# Patient Record
Sex: Female | Born: 1975 | Race: White | Hispanic: No | Marital: Married | Smoking: Never smoker
Health system: Southern US, Community
[De-identification: ages and names within clinical notes are randomized; demographics above are authoritative.]

## PROBLEM LIST (undated history)

## (undated) ENCOUNTER — Inpatient Hospital Stay (HOSPITAL_COMMUNITY): Payer: Self-pay

## (undated) DIAGNOSIS — N3010 Interstitial cystitis (chronic) without hematuria: Secondary | ICD-10-CM

## (undated) DIAGNOSIS — K602 Anal fissure, unspecified: Secondary | ICD-10-CM

## (undated) DIAGNOSIS — K219 Gastro-esophageal reflux disease without esophagitis: Secondary | ICD-10-CM

## (undated) HISTORY — PX: CHOLECYSTECTOMY: SHX55

## (undated) HISTORY — DX: Anal fissure, unspecified: K60.2

## (undated) HISTORY — DX: Gastro-esophageal reflux disease without esophagitis: K21.9

## (undated) HISTORY — DX: Interstitial cystitis (chronic) without hematuria: N30.10

## (undated) HISTORY — PX: APPENDECTOMY: SHX54

## (undated) HISTORY — PX: ENDOMETRIAL ABLATION: SHX621

---

## 2006-08-19 DIAGNOSIS — K602 Anal fissure, unspecified: Secondary | ICD-10-CM

## 2006-08-19 HISTORY — DX: Anal fissure, unspecified: K60.2

## 2014-09-06 ENCOUNTER — Emergency Department (HOSPITAL_COMMUNITY): Payer: Federal, State, Local not specified - PPO

## 2014-09-06 ENCOUNTER — Encounter (HOSPITAL_COMMUNITY): Payer: Self-pay | Admitting: *Deleted

## 2014-09-06 ENCOUNTER — Emergency Department (HOSPITAL_COMMUNITY)
Admission: EM | Admit: 2014-09-06 | Discharge: 2014-09-06 | Disposition: A | Discharge status: Home / Self Care | Payer: Federal, State, Local not specified - PPO | Attending: Emergency Medicine | Admitting: Emergency Medicine

## 2014-09-06 DIAGNOSIS — Y9289 Other specified places as the place of occurrence of the external cause: Secondary | ICD-10-CM | POA: Insufficient documentation

## 2014-09-06 DIAGNOSIS — Y9351 Activity, roller skating (inline) and skateboarding: Secondary | ICD-10-CM | POA: Diagnosis not present

## 2014-09-06 DIAGNOSIS — Y998 Other external cause status: Secondary | ICD-10-CM | POA: Insufficient documentation

## 2014-09-06 DIAGNOSIS — S62141A Displaced fracture of body of hamate [unciform] bone, right wrist, initial encounter for closed fracture: Secondary | ICD-10-CM

## 2014-09-06 DIAGNOSIS — Z7982 Long term (current) use of aspirin: Secondary | ICD-10-CM | POA: Insufficient documentation

## 2014-09-06 DIAGNOSIS — S6991XA Unspecified injury of right wrist, hand and finger(s), initial encounter: Secondary | ICD-10-CM | POA: Diagnosis present

## 2014-09-06 DIAGNOSIS — W19XXXA Unspecified fall, initial encounter: Secondary | ICD-10-CM

## 2014-09-06 DIAGNOSIS — S6990XA Unspecified injury of unspecified wrist, hand and finger(s), initial encounter: Secondary | ICD-10-CM

## 2014-09-06 DIAGNOSIS — S62144A Nondisplaced fracture of body of hamate [unciform] bone, right wrist, initial encounter for closed fracture: Secondary | ICD-10-CM | POA: Insufficient documentation

## 2014-09-06 MED ORDER — IBUPROFEN 800 MG PO TABS
800.0000 mg | ORAL_TABLET | Freq: Once | ORAL | Status: AC
  Administered 2014-09-06: 800 mg via ORAL
  Filled 2014-09-06: qty 1

## 2014-09-06 NOTE — ED Notes (Signed)
Pt escorted to discharge window. Pt verbalized understanding discharge instructions. In no acute distress.  

## 2014-09-06 NOTE — ED Provider Notes (Signed)
CSN: [id number]     Arrival date & time 09/06/14  0750 History   First MD Initiated Contact with Patient 09/06/14 0754     Chief Complaint  Patient presents with  . Wrist Injury  . Hand Injury     (Consider location/radiation/quality/duration/timing/severity/associated sxs/prior Treatment) HPI  Casey Oneill is a 39 y.o. female is otherwise healthy presenting with fall last night while rollerskating. She noted bilateral wrist pain that improves some with Bayer Aspirin but has been persistent. It is worse with movement. Patient states when she fell she fell on her back and held her arms up but she did notice some abrasions on her forearm and cannot recall hitting her wrist on anything. She denies any back pain, loss of control of bladder or bowel, numbness tingling, weakness or saddle ansesthesia. Patient notes increased pain on the right hand in the distribution of fourth and fifth fingers. She denies weakness but reports increased pain with movement.   History reviewed. No pertinent past medical history. Past Surgical History  Procedure Laterality Date  . Cesarean section    . Endometrial ablation      removal of scar tissue   History reviewed. No pertinent family history. History  Substance Use Topics  . Smoking status: Never Smoker   . Smokeless tobacco: Not on file  . Alcohol Use: Yes     Comment: rarely   OB History    No data available     Review of Systems  Constitutional: Negative for fever and chills.  Skin: Positive for wound. Negative for color change.  Neurological: Negative for weakness and numbness.      Allergies  Review of patient's allergies indicates no known allergies.  Home Medications   Prior to Admission medications   Medication Sig Start Date End Date Taking? Authorizing Provider  aspirin 500 MG tablet Take 1,500 mg by mouth 2 (two) times daily as needed for pain.   Yes Historical Provider, MD   BP 122/75 mmHg  Pulse 67  Temp(Src) 97.4 F  (36.3 C) (Oral)  Resp 16  SpO2 99%  LMP 08/23/2014 (Exact Date) Physical Exam  Constitutional: She appears well-developed and well-nourished. No distress.  HENT:  Head: Normocephalic and atraumatic.  Eyes: Conjunctivae are normal. Right eye exhibits no discharge. Left eye exhibits no discharge.  Cardiovascular:  2+ radial pulses equal bilaterally. Less than 3 seconds cap refill  Pulmonary/Chest: Effort normal. No respiratory distress.  Musculoskeletal:  No erythema, edema, warmth to bilateral fingers wrist and forearm. Full range of motion. Tenderness to right fourth and fifth fingers as well as dorsal hand. Tenderness with range of motion of left wrist.  Neurological: She is alert. Coordination normal.  5/5 strength of bilateral upper extremities. Sensation intact.  Skin: She is not diaphoretic.  Psychiatric: She has a normal mood and affect. Her behavior is normal.  Nursing note and vitals reviewed.   ED Course  Procedures (including critical care time) Labs Review Labs Reviewed - No data to display  Imaging Review Dg Wrist Complete Left  09/06/2014   CLINICAL DATA:  Fall roller-skating. Wrist pain. Initial encounter.  EXAM: LEFT WRIST - COMPLETE 3+ VIEW  COMPARISON:  None.  FINDINGS: There is no evidence of fracture or dislocation. There is no evidence of arthropathy or other focal bone abnormality. Soft tissues are unremarkable.  IMPRESSION: Negative.   Electronically Signed   By: Marlan Palauharles  Clark M.D.   On: 09/06/2014 09:19   Dg Wrist Complete Right  09/06/2014  CLINICAL DATA:  Fall roller-skating last night. Wrist pain. Initial encounter.  EXAM: RIGHT WRIST - COMPLETE 3+ VIEW  COMPARISON:  None.  FINDINGS: Nondisplaced fracture of the medial hamate. No other fracture or arthropathy.  IMPRESSION: Nondisplaced fracture hamate   Electronically Signed   By: Marlan Palau M.D.   On: 09/06/2014 08:59     EKG Interpretation None      MDM   Final diagnoses:  Wrist injury   Closed hamate fracture, right, initial encounter  Fall, initial encounter   Patient X-Ray positive for right nondisplaced fracture of hamate. No obvious fracture or dislocation in left wrist. Bilaterally neuro vascularly intact. Right splint placed and patient to follow-up with hand orthopedic surgeon. Pain managed in ED. Pt without erythema, edema or warmth to joint. I doubt septic arthritis. Pt advised to follow up with PCP/orthopedics if symptoms persist. Disscussed RICE, ibuprofen and conservative therapy recommended and discussed. Answered all questions about splint care.  Discussed return precautions with patient. Discussed all results and patient verbalizes understanding and agrees with plan.    Louann Sjogren, PA-C 09/06/14 1106  Enid Skeens, MD 09/06/14 [phone]

## 2014-09-06 NOTE — ED Notes (Signed)
Pt alert and oriented x4. Respirations even and unlabored, bilateral symmetrical rise and fall of chest. Skin warm and dry. In no acute distress. Denies needs.   

## 2014-09-06 NOTE — ED Notes (Signed)
Ortho called 

## 2014-09-06 NOTE — ED Notes (Signed)
Pt reports she went roller skating last night, was standing on rink and legs slipped out from under herself, pt fell backwards landing on lower back, hit head, denies LOC. Pt does not think she used hands to brace impact, because she was holding a cell phone. Pt did land on upper arms, possibly elbows. As night progressed pt started having right wrist pain with rotation and stiffness, with slight numbness in right ring finger and pinkie finger. Decreased strength in fingers. This morning now slight numbness in left ring finger and left pinkie finger as well as right hand fingers.

## 2014-09-06 NOTE — Discharge Instructions (Signed)
Return to the emergency room with worsening of symptoms, new symptoms or with symptoms that are concerning, especially fevers, redness, swelling, unable to move fingers under cast, blue tinge to skin, numbness, tingling, weakness. Call to make follow-up appointment with hand orthopedist. RICE: Rest, Ice (three cycles of 20 mins on, 20mins off at least twice a day), compression/brace, elevation. Heating pad works well for back pain. Ibuprofen 400mg  (2 tablets 200mg ) every 5-6 hours for 3-5 days  Follow up with orthopedist. Follow-up with recommendations for splint care.   Cast or Splint Care Casts and splints support injured limbs and keep bones from moving while they heal. It is important to care for your cast or splint at home.  HOME CARE INSTRUCTIONS  Keep the cast or splint uncovered during the drying period. It can take 24 to 48 hours to dry if it is made of plaster. A fiberglass cast will dry in less than 1 hour.  Do not rest the cast on anything harder than a pillow for the first 24 hours.  Do not put weight on your injured limb or apply pressure to the cast until your health care provider gives you permission.  Keep the cast or splint dry. Wet casts or splints can lose their shape and may not support the limb as well. A wet cast that has lost its shape can also create harmful pressure on your skin when it dries. Also, wet skin can become infected.  Cover the cast or splint with a plastic bag when bathing or when out in the rain or snow. If the cast is on the trunk of the body, take sponge baths until the cast is removed.  If your cast does become wet, dry it with a towel or a blow dryer on the cool setting only.  Keep your cast or splint clean. Soiled casts may be wiped with a moistened cloth.  Do not place any hard or soft foreign objects under your cast or splint, such as cotton, toilet paper, lotion, or powder.  Do not try to scratch the skin under the cast with any object. The  object could get stuck inside the cast. Also, scratching could lead to an infection. If itching is a problem, use a blow dryer on a cool setting to relieve discomfort.  Do not trim or cut your cast or remove padding from inside of it.  Exercise all joints next to the injury that are not immobilized by the cast or splint. For example, if you have a long leg cast, exercise the hip joint and toes. If you have an arm cast or splint, exercise the shoulder, elbow, thumb, and fingers.  Elevate your injured arm or leg on 1 or 2 pillows for the first 1 to 3 days to decrease swelling and pain.It is best if you can comfortably elevate your cast so it is higher than your heart. SEEK MEDICAL CARE IF:   Your cast or splint cracks.  Your cast or splint is too tight or too loose.  You have unbearable itching inside the cast.  Your cast becomes wet or develops a soft spot or area.  You have a bad smell coming from inside your cast.  You get an object stuck under your cast.  Your skin around the cast becomes red or raw.  You have new pain or worsening pain after the cast has been applied. SEEK IMMEDIATE MEDICAL CARE IF:   You have fluid leaking through the cast.  You are unable to  move your fingers or toes.  You have discolored (blue or white), cool, painful, or very swollen fingers or toes beyond the cast.  You have tingling or numbness around the injured area.  You have severe pain or pressure under the cast.  You have any difficulty with your breathing or have shortness of breath.  You have chest pain. Document Released: 08/02/2000 Document Revised: 05/26/2013 Document Reviewed: 02/11/2013 [facility] Patient Information 2015 [place], [place]. This information is not intended to replace advice given to you by your health care provider. Make sure you discuss any questions you have with your health care provider.    Emergency Department Resource Guide 1) Find a Doctor and Pay Out of  Pocket Although you won't have to find out who is covered by your insurance plan, it is a good idea to ask around and get recommendations. You will then need to call the office and see if the doctor you have chosen will accept you as a new patient and what types of options they offer for patients who are self-pay. Some doctors offer discounts or will set up payment plans for their patients who do not have insurance, but you will need to ask so you aren't surprised when you get to your appointment.  2) Contact Your Local Health Department Not all health departments have doctors that can see patients for sick visits, but many do, so it is worth a call to see if yours does. If you don't know where your local health department is, you can check in your phone book. The CDC also has a tool to help you locate your state's health department, and many state websites also have listings of all of their local health departments.  3) Find a Walk-in Clinic If your illness is not likely to be very severe or complicated, you may want to try a walk in clinic. These are popping up all over the country in pharmacies, drugstores, and shopping centers. They're usually staffed by nurse practitioners or physician assistants that have been trained to treat common illnesses and complaints. They're usually fairly quick and inexpensive. However, if you have serious medical issues or chronic medical problems, these are probably not your best option.  No Primary Care Doctor: - Call Health Connect at  [phone] - they can help you locate a primary care doctor that  accepts your insurance, provides certain services, etc. - Physician Referral Service- [phone]  Chronic Pain Problems: Organization         Address  Phone   Notes  [facility]  [phone] Patients need to be referred by their primary care doctor.   Medication Assistance: Organization         Address  Phone   Notes  [facility]  Medication [facility] [address] [place], [place] [address] [phone] --Must be a resident of [facility] -- Must have NO insurance coverage whatsoever (no Medicaid/ Medicare, etc.) -- The pt. MUST have a primary care doctor that directs their care regularly and follows them in the community   [facility]ist  [phone]   [employer]  [phone]    Agencies that provide inexpensive medical care: Organization         Address  Phone   Notes  [facility]  [phone]   [facility]    [phone]   [facility] [address] [place], [place] [address] [phone]  [facility] [address] [place] [place] [phone]   Planned Parenthood    [phone]   [facility]    [phone]   Community [facility] and [facility]  [address], [place] Phone:  [phone], Fax:  [phone] Hours of Operation:  9 am - 6 pm, M-F.  Also accepts Medicaid/Medicare and self-pay.  [facility]  [address], [place] Phone: [phone], Fax: [phone]. Hours of Operation:  8:30 am - 5:30 pm, M-F.  Also accepts Medicaid and self-pay.  [facility] [place] [address] [place] Phone: [phone]   [facility] [address] Natasha Bence [place], [place] [phone], Ext. [phone] Mondays & Thursdays: 7-9 AM.  First 15 patients are seen on a first come, first serve basis.    Medicaid-accepting [facility] Providers:  Organization         Address  Phone   Notes  [facility] [address],  [phone] Also accepts self-pay patients.  [facility] [address] Laurell Josephs [place], [place]  [phone]   [facility] [address], [place] [phone]   [facility] [address], [place] [phone]   Renaye Rakers [address], [place]   [phone] Only accepts [place] Access [place] patients after they have their name applied to their card.   Self-Pay (no insurance) in [facility]:  Organization         Address  Phone   Notes  Sickle Cell Patients, [facility] [address] [place], [place] [phone]   [facility] [address] [place], [place] [phone]   [facility] [place]  [address], [place] [phone]   [facility]/Dr. Osei-Bonsu  [address], [place] or [address], [place] [phone] Phone number for both [place] and [place] locations is the same.  [facility]gent [facility] and [facility] [address] [place] [phone]   [facility] [address], [place] or [address] [phone]   [facility] [address] [place] [phone], phone; [phone], fax Sees patients 1st and 3rd Saturday of every month.  Must not qualify for public or private insurance (i.e. Medicaid, Medicare, [place] Health Choice, Veterans' Benefits)  Household income should be no more than 200% of the poverty level The clinic cannot treat you if you are pregnant or think you are pregnant  Sexually transmitted diseases are not treated at the clinic.    Dental Care: Organization         Address  Phone  Notes  [facility] Department of [facility] [address] [place], [place] [phone] Accepts children up to age 57 who are enrolled in [place] or [place] Health Choice; pregnant women with a Medicaid card; and children who have applied for Medicaid or [place] Health Choice, but were declined, whose parents can pay a reduced fee at time of service.  [facility] Department of [facility]  [address], [place]  [phone] Accepts children up to age 51 who are enrolled in [place] or [place] Health Choice; pregnant women with a Medicaid card; and  children who have applied for Medicaid or [place] Health Choice, but were declined, whose parents can pay a reduced fee at time of service.  [facility]  [address] [place], [place] [phone] Patients are seen by appointment only. Walk-ins are not accepted. [facility] will see patients 93 years of age and older. Monday - Tuesday (8am-5pm) Most Wednesdays (8:30-5pm) $30 per visit, cash only  [facility]  [address], [facility] [phone] Patients are seen by appointment only. Walk-ins are not accepted. [facility] will see patients 34 years of age and older. One Wednesday Evening (Monthly: Volunteer Based).  $30 per visit, cash only  [employer]  [phone] for adults; Children under age 55, call Graduate Pediatric Dentistry at [phone]. Children aged 20-14, please call [phone] to request a pediatric application.  Dental services are provided in all areas of dental care including fillings, crowns and bridges, complete and partial dentures, implants, gum treatment, root canals, and extractions. Preventive care is also provided. Treatment is provided to both adults and children. Patients are selected via a lottery and there is often a waiting list.   [facility] [address] [place]  [phone] [email or web address]   [facility] [address] [place], [place] [phone], Ext. [phone] Second and Fourth Thursday of each month, opens at 6:30 AM; Clinic ends at 9 AM.  Patients are seen on a first-come first-served basis, and a limited number are seen during each clinic.   [facility]  [address] Ether Griffins [place], [place] [phone]   Eligibility Requirements You must have lived in [place], [place], or [place]  counties for at least the last three months.   You cannot be eligible for state or federal sponsored [place], including [employer], [place], or [employer].   You generally cannot be eligible for healthcare insurance through your employer.    How to apply: Eligibility screenings are held every Tuesday and Wednesday afternoon from 1:00 pm until 4:00 pm. You do not need an appointment for the interview!  [facility] [address] [place], [place] [phone]   [facility] Health Department  [phone]   [facility] Health Department  [phone]   [facility] Health Department  [phone]    Behavioral Health Resources in the Community: Intensive Outpatient Programs Organization         Address  Phone  Notes  [facility] Services [address] [place], [place] [phone]   [facility] [address] [place], [place] [phone]   [facility] [address] [place], [place]  [phone]   [facility] Mental Health [address],  [place], [place] [phone] or [phone]   Substance Abuse Resources Organization         Address  Phone  Notes  [facility] Drug Services  [phone]   [facility]  [phone]   [facility] [place]  [phone]   Floydene Flock  [phone]   Residential & Outpatient Substance Abuse Program  [phone]   Psychological Services Organization         Address  Phone  Notes  [facility]  [phone]   [facility]  [phone]   [facility] [address], [place] [phone] or [phone]    Mobile Crisis Teams Organization  Address  Phone  Notes  [facility], Mobile Crisis Care Unit  [phone]   [facility]  [address] [place], [place] [phone]   [facility] [address] [place]  [place] [phone]    Self-Help/Support Groups Organization         Address  Phone             Notes  [facility] [place] - variety of support groups  336- I7437963 Call for more information  Narcotics Anonymous (NA), [facility] [address], [employer] [place]  2 meetings at this location   Statistician         Address  Phone  Notes  [facility] [address],    [place]  [phone]   [facility]  [address], [place] [address], [place], [place] [phone]   [facility] [address] [place], [place] [phone] Admissions: 8am-3pm M-F  [facility] [address],    [place], [place] [phone]   [facility] [address] [place], [place], [place] [phone]   [facility] [address]  [place], [place] [phone]   [facility] - Intensive Outpatient [address] Laurell Josephs [address], [place], [place] [phone]   [facility] ([facility]iction [facility] [address] [place]  [place], [place] [phone] or [phone]) [phone]   [facility] ([facility]) [address] [place], [place] [phone] Accepts Medicaid  [facility] [address],  [place] [phone] Substance Abuse/Addiction Treatment   [facility] Organization         Address  Phone  Notes  [facility]  [phone]   Angie Fava, PhD [address] Ervin Knack [place], [place]   5068507955 or [phone]   [facility] Behavioral   [address] [place], [place] [phone]   [facility] [address] [place], [place] [phone] Insurance/Medicaid/sponsorship through [facility] and Families [address]                                    [place], [place] [phone] Therapy/tele-psych/case  [facility] [address], [place] [phone]    Dr. Lolly Mustache  [phone]   [facility]  [employer] [facility] Dept. 1) [address] [place] 2) [address], [place] 3)  [address], [place] [phone]  [phone]   [facility] Child Abuse Hotline 5515089580 or [phone] (After Hours)

## 2014-09-06 NOTE — ED Notes (Signed)
PA at bedside.

## 2015-01-17 ENCOUNTER — Encounter: Payer: Self-pay | Admitting: Internal Medicine

## 2015-03-15 ENCOUNTER — Encounter: Payer: Self-pay | Admitting: Internal Medicine

## 2015-03-15 ENCOUNTER — Ambulatory Visit (INDEPENDENT_AMBULATORY_CARE_PROVIDER_SITE_OTHER): Payer: Federal, State, Local not specified - PPO | Admitting: Internal Medicine

## 2015-03-15 VITALS — BP 120/78 | HR 80 | Ht 60.0 in | Wt 197.2 lb

## 2015-03-15 DIAGNOSIS — K219 Gastro-esophageal reflux disease without esophagitis: Secondary | ICD-10-CM | POA: Diagnosis not present

## 2015-03-15 DIAGNOSIS — E669 Obesity, unspecified: Secondary | ICD-10-CM

## 2015-03-15 DIAGNOSIS — R1310 Dysphagia, unspecified: Secondary | ICD-10-CM | POA: Diagnosis not present

## 2015-03-15 MED ORDER — OMEPRAZOLE 40 MG PO CPDR: 40.0000 mg | DELAYED_RELEASE_CAPSULE | Freq: Every day | ORAL | Status: DC

## 2015-03-15 NOTE — Patient Instructions (Signed)
We have sent the following medications to your pharmacy for you to pick up at your convenience:  Omeprazole  You have been scheduled for an endoscopy. Please follow written instructions given to you at your visit today. If you use inhalers (even only as needed), please bring them with you on the day of your procedure.  

## 2015-03-15 NOTE — Progress Notes (Signed)
HISTORY OF PRESENT ILLNESS:  Casey Oneill is a pleasant 39 y.o. female who is self-referred today with chief complaint of chronic reflux disease and progressive dysphagia. The patient moved to [place] from [place] January 2016. She tells me that she has had long-standing reflux disease and previously underwent upper endoscopy was unremarkable. She has also undergone previous colonoscopy for rectal bleeding secondary to fissure. Finally, she is status post cholecystectomy. The patient reports to me that after initially being diagnosed with GERD she was placed on omeprazole which helps. At some point she came off medication. For some time now she has had problems with pyrosis and regurgitation on a daily basis. Symptoms are particular bad at night. She takes Tums constantly. Also, over the past 4 years she has had progressive intermittent solid food dysphagia. The current frequency is near daily. Symptoms occurred after her remote EGD. She also reports taking ibuprofen regularly for hip pain. Patient's GI review of systems is otherwise negative. She is significantly overweight  REVIEW OF SYSTEMS:  All non-GI ROS negative except for sleeping problems, excessive urination, urinary leakage  Past Medical History  Diagnosis Date  . GERD (gastroesophageal reflux disease)   . Interstitial cystitis   . Anal fissure 2008    Past Surgical History  Procedure Laterality Date  . Cesarean section    . Endometrial ablation      removal of scar tissue    Social History Casey Oneill  reports that she has never smoked. She has never used smokeless tobacco. She reports that she does not drink alcohol or use illicit drugs.  family history includes Breast cancer in her paternal grandmother; Colon cancer in her maternal grandmother; Colon polyps in her maternal grandmother.  No Known Allergies     PHYSICAL EXAMINATION: Vital signs: BP 120/78 mmHg  Pulse 80  Ht 5' (1.524 m)  Wt 197 lb 3.2 oz  (89.449 kg)  BMI 38.51 kg/m2  LMP 02/21/2015  Constitutional: generally well-appearing, no acute distress Psychiatric: alert and oriented x3, cooperative Eyes: extraocular movements intact, anicteric, conjunctiva pink Mouth: oral pharynx moist, no lesions Neck: supple no lymphadenopathy Cardiovascular: heart regular rate and rhythm, no murmur Lungs: clear to auscultation bilaterally Abdomen: soft, obese, nontender, nondistended, no obvious ascites, no peritoneal signs, normal bowel sounds, no organomegaly Rectal: Omitted Extremities: no lower extremity edema bilaterally Skin: no lesions on visible extremities Neuro: No focal deficits. No asterixis.    ASSESSMENT:  #1. Chronic GERD. Poorly controlled #2. Progressive intermittent solid food dysphagia, near daily now. Rule out peptic stricture #3. Obesity   PLAN:  #1. Reflux precautions with attention to weight loss #2. Prescribe omeprazole 40 mg daily #3. Schedule upper endoscopy with probable esophageal dilation.The nature of the procedure, as well as the risks, benefits, and alternatives were carefully and thoroughly reviewed with the patient. Ample time for discussion and questions allowed. The patient understood, was satisfied, and agreed to proceed.

## 2015-05-23 ENCOUNTER — Encounter: Payer: Self-pay | Admitting: Internal Medicine

## 2015-05-23 ENCOUNTER — Ambulatory Visit (AMBULATORY_SURGERY_CENTER): Payer: Federal, State, Local not specified - PPO | Admitting: Internal Medicine

## 2015-05-23 VITALS — BP 110/76 | HR 76 | Temp 98.5°F | Resp 20 | Ht 60.0 in | Wt 197.0 lb

## 2015-05-23 DIAGNOSIS — K219 Gastro-esophageal reflux disease without esophagitis: Secondary | ICD-10-CM

## 2015-05-23 DIAGNOSIS — K222 Esophageal obstruction: Secondary | ICD-10-CM | POA: Diagnosis not present

## 2015-05-23 MED ORDER — SODIUM CHLORIDE 0.9 % IV SOLN: 500.0000 mL | INTRAVENOUS | Status: DC

## 2015-05-23 NOTE — Patient Instructions (Signed)
YOU HAD AN ENDOSCOPIC PROCEDURE TODAY AT THE [facility]:   Refer to the procedure report that was given to you for any specific questions about what was found during the examination.  If the procedure report does not answer your questions, please call your gastroenterologist to clarify.  If you requested that your care partner not be given the details of your procedure findings, then the procedure report has been included in a sealed envelope for you to review at your convenience later.  YOU SHOULD EXPECT: Some feelings of bloating in the abdomen. Passage of more gas than usual.  Walking can help get rid of the air that was put into your GI tract during the procedure and reduce the bloating. If you had a lower endoscopy (such as a colonoscopy or flexible sigmoidoscopy) you may notice spotting of blood in your stool or on the toilet paper. If you underwent a bowel prep for your procedure, you may not have a normal bowel movement for a few days.  Please Note:  You might notice some irritation and congestion in your nose or some drainage.  This is from the oxygen used during your procedure.  There is no need for concern and it should clear up in a day or so.  SYMPTOMS TO REPORT IMMEDIATELY:    Following upper endoscopy (EGD)  Vomiting of blood or coffee ground material  New chest pain or pain under the shoulder blades  Painful or persistently difficult swallowing  New shortness of breath  Fever of 100F or higher  Black, tarry-looking stools  For urgent or emergent issues, a gastroenterologist can be reached at any hour by calling [phone].   DIET: Your first meal following the procedure should be a small meal and then it is ok to progress to your normal diet. Heavy or fried foods are harder to digest and may make you feel nauseous or bloated.  Likewise, meals heavy in dairy and vegetables can increase bloating.  Drink plenty of fluids but you should avoid alcoholic beverages  for 24 hours.  ACTIVITY:  You should plan to take it easy for the rest of today and you should NOT DRIVE or use heavy machinery until tomorrow (because of the sedation medicines used during the test).    FOLLOW UP: Our staff will call the number listed on your records the next business day following your procedure to check on you and address any questions or concerns that you may have regarding the information given to you following your procedure. If we do not reach you, we will leave a message.  However, if you are feeling well and you are not experiencing any problems, there is no need to return our call.  We will assume that you have returned to your regular daily activities without incident.  If any biopsies were taken you will be contacted by phone or by letter within the next 1-3 weeks.  Please call us at [phone] if you have not heard about the biopsies in 3 weeks.    SIGNATURES/CONFIDENTIALITY: You and/or your care partner have signed paperwork which will be entered into your electronic medical record.  These signatures attest to the fact that that the information above on your After Visit Summary has been reviewed and is understood.  Full responsibility of the confidentiality of this discharge information lies with you and/or your care-partner.   GERD information given.  Stricture information given.  Post dilation diet given with instructions.  Continue omeprazole .  Daily.  See Dr. Marina Goodell in 3 months.

## 2015-05-23 NOTE — Op Note (Signed)
[facility] [address] [place], [address]   ENDOSCOPY PROCEDURE REPORT  PATIENT: Casey, Oneill  MR#: [id number] BIRTHDATE: Jan 01, 1976 , 38  yrs. old GENDER: female ENDOSCOPIST: Roxy Cedar, MD REFERRED BY:  .  Self / Office PROCEDURE DATE:  05/23/2015 PROCEDURE:  EGD, diagnostic and Maloney dilation of esophagus   - 26F ASA CLASS:     Class II INDICATIONS:  history of esophageal reflux and dysphagia(progressive intermittent solid food dysphagia). MEDICATIONS: Monitored anesthesia care and Propofol 350 mg IV TOPICAL ANESTHETIC: none  DESCRIPTION OF PROCEDURE: After the risks benefits and alternatives of the procedure were thoroughly explained, informed consent was obtained.  The LB [id number] endoscope was introduced through the mouth and advanced to the second portion of the duodenum , Without limitations.  The instrument was slowly withdrawn as the mucosa was fully examined.  EXAM:The esophagus revealed subtle suggestion of furrowing without significant narrowing.  However, at the gastroesophageal junction was a ringlike peptic stricture measuring approximately 14 mm.  No significant active esophagitis.  Stomach was normal with a 3 cm hiatal hernia.  The duodenum was normal.  Retroflexed views revealed a hiatal hernia.     The scope was then withdrawn from the patient. THERAPY: 54 French Maloney dilator was passed into the esophagus without resistance or heme. Tolerated well. Relook endoscopy demonstrated disruption of the distal stricture without other issues.  COMPLICATIONS: There were no immediate complications,but the patient did vomit immediately postprocedure with aspiration and transient hypoxia that resolved promptly with supportive care.  ENDOSCOPIC IMPRESSION: 1. GERD complicated by peptic stricture status post esophageal dilation-54 Jerene Dilling  RECOMMENDATIONS: 1.  Clear liquids until 4 PM, then soft foods rest of day.   Resume prior diet tomorrow. 2.  Continue omeprazole 40 mg daily 3. Please make a follow-up office appointment with Dr. Marina Goodell in about 3 months  REPEAT EXAM:  eSigned:  Roxy Cedar, MD 05/23/2015 2:38 PM    CC:The Patient

## 2015-05-23 NOTE — Progress Notes (Signed)
Called to room to assist during endoscopic procedure.  Patient ID and intended procedure confirmed with present staff. Received instructions for my participation in the procedure from the performing physician.  

## 2015-05-23 NOTE — Progress Notes (Signed)
Transferred to recovery room. A/O x3, pleased with MAC.  VSS.  Report to Jane, RN. 

## 2015-05-23 NOTE — Progress Notes (Signed)
Per Everlean Alstrom RN- pt dilated at 1420 with 54 maloney- no heme and no difficulties  Per Everlean Alstrom [phone]- pt's oxygen levels desaturated and became dusky in appearance.  Oxygen turned up to 12/L, oral airway inserted and pt suctioned extensively.  Dr. Marina Goodell, Dr. Christella Hartigan, Donivan Scull CRNA, and J Monday CRNA, Precious Haws RN, and  Florina Ou at bedside at bedside.  Writer in the room to transcribe events now.  Oxygen levels immediately came up to the 90's.    1425-pt coughing spontaneously  1427- pt awake and talking, oxygen levels high 90's  1428-  Pt transferred to RR

## 2015-05-24 ENCOUNTER — Telehealth: Payer: Self-pay | Admitting: *Deleted

## 2015-05-24 NOTE — Telephone Encounter (Signed)
  Follow up Call-  Call back number 05/23/2015  Post procedure Call Back phone  # [phone] hm  Permission to leave phone message Yes    [facility]

## 2015-05-25 ENCOUNTER — Encounter: Payer: Federal, State, Local not specified - PPO | Admitting: Internal Medicine

## 2015-08-02 ENCOUNTER — Encounter: Payer: Self-pay | Admitting: Internal Medicine

## 2015-10-16 ENCOUNTER — Ambulatory Visit: Payer: Federal, State, Local not specified - PPO | Admitting: Adult Health

## 2016-01-02 DIAGNOSIS — E669 Obesity, unspecified: Secondary | ICD-10-CM | POA: Insufficient documentation

## 2016-01-04 ENCOUNTER — Other Ambulatory Visit: Payer: Self-pay | Admitting: Obstetrics and Gynecology

## 2016-01-04 ENCOUNTER — Inpatient Hospital Stay (HOSPITAL_COMMUNITY)
Admission: AD | Admit: 2016-01-04 | Discharge: 2016-01-04 | Disposition: A | Discharge status: Home / Self Care | Payer: Federal, State, Local not specified - PPO | Source: Ambulatory Visit | Attending: Obstetrics and Gynecology | Admitting: Obstetrics and Gynecology

## 2016-01-04 ENCOUNTER — Inpatient Hospital Stay (HOSPITAL_COMMUNITY): Payer: Federal, State, Local not specified - PPO

## 2016-01-04 ENCOUNTER — Encounter (HOSPITAL_COMMUNITY): Payer: Self-pay | Admitting: *Deleted

## 2016-01-04 DIAGNOSIS — K219 Gastro-esophageal reflux disease without esophagitis: Secondary | ICD-10-CM | POA: Insufficient documentation

## 2016-01-04 DIAGNOSIS — Z3A12 12 weeks gestation of pregnancy: Secondary | ICD-10-CM | POA: Diagnosis not present

## 2016-01-04 DIAGNOSIS — O210 Mild hyperemesis gravidarum: Secondary | ICD-10-CM | POA: Insufficient documentation

## 2016-01-04 DIAGNOSIS — O26849 Uterine size-date discrepancy, unspecified trimester: Secondary | ICD-10-CM

## 2016-01-04 DIAGNOSIS — R1110 Vomiting, unspecified: Secondary | ICD-10-CM | POA: Diagnosis present

## 2016-01-04 MED ORDER — SODIUM CHLORIDE 0.9 % IV SOLN
8.0000 mg | Freq: Once | INTRAVENOUS | Status: DC
  Filled 2016-01-04: qty 4

## 2016-01-04 MED ORDER — M.V.I. ADULT IV INJ
Freq: Once | INTRAVENOUS | Status: AC
  Administered 2016-01-04: 12:00:00 via INTRAVENOUS
  Filled 2016-01-04: qty 10

## 2016-01-04 MED ORDER — ONDANSETRON HCL 4 MG/2ML IJ SOLN
4.0000 mg | Freq: Once | INTRAMUSCULAR | Status: AC
  Administered 2016-01-04: 4 mg via INTRAVENOUS
  Filled 2016-01-04: qty 2

## 2016-01-04 MED ORDER — ONDANSETRON HCL 4 MG PO TABS: 4.0000 mg | ORAL_TABLET | Freq: Three times a day (TID) | ORAL | Status: DC | PRN

## 2016-01-04 MED ORDER — DEXTROSE 5 % IN LACTATED RINGERS IV BOLUS: 1000.0000 mL | Freq: Once | INTRAVENOUS | Status: DC

## 2016-01-04 MED ORDER — PANTOPRAZOLE SODIUM 40 MG IV SOLR
40.0000 mg | Freq: Once | INTRAVENOUS | Status: AC
  Administered 2016-01-04: 40 mg via INTRAVENOUS
  Filled 2016-01-04: qty 40

## 2016-01-04 NOTE — MAU Note (Signed)
Pt sent from MD office for dehydration, is 12 weeks, unable to keep anything down for 3 weeks.  Denies diarrhea or pain.

## 2016-01-04 NOTE — Discharge Instructions (Signed)
Hyperemesis Gravidarum  Hyperemesis gravidarum is a severe form of nausea and vomiting that happens during pregnancy. Hyperemesis is worse than morning sickness. It may cause you to have nausea or vomiting all day for many days. It may keep you from eating and drinking enough food and liquids. Hyperemesis usually occurs during the first half (the first 20 weeks) of pregnancy. It often goes away once a woman is in her second half of pregnancy. However, sometimes hyperemesis continues through an entire pregnancy.   CAUSES   The cause of this condition is not completely known but is thought to be related to changes in the body's hormones when pregnant. It could be from the high level of the pregnancy hormone or an increase in estrogen in the body.   SIGNS AND SYMPTOMS    Severe nausea and vomiting.   Nausea that does not go away.   Vomiting that does not allow you to keep any food down.   Weight loss and body fluid loss (dehydration).   Having no desire to eat or not liking food you have previously enjoyed.  DIAGNOSIS   Your health care provider will do a physical exam and ask you about your symptoms. He or she may also order blood tests and urine tests to make sure something else is not causing the problem.   TREATMENT   You may only need medicine to control the problem. If medicines do not control the nausea and vomiting, you will be treated in the hospital to prevent dehydration, increased acid in the blood (acidosis), weight loss, and changes in the electrolytes in your body that may harm the unborn baby (fetus). You may need IV fluids.   HOME CARE INSTRUCTIONS    Only take over-the-counter or prescription medicines as directed by your health care provider.   Try eating a couple of dry crackers or toast in the morning before getting out of bed.   Avoid foods and smells that upset your stomach.   Avoid fatty and spicy foods.   Eat 5-6 small meals a day.   Do not drink when eating meals. Drink between  meals.   For snacks, eat high-protein foods, such as cheese.   Eat or suck on things that have ginger in them. Ginger helps nausea.   Avoid food preparation. The smell of food can spoil your appetite.   Avoid iron pills and iron in your multivitamins until after 3-4 months of being pregnant. However, consult with your health care provider before stopping any prescribed iron pills.  SEEK MEDICAL CARE IF:    Your abdominal pain increases.   You have a severe headache.   You have vision problems.   You are losing weight.  SEEK IMMEDIATE MEDICAL CARE IF:    You are unable to keep fluids down.   You vomit blood.   You have constant nausea and vomiting.   You have excessive weakness.   You have extreme thirst.   You have dizziness or fainting.   You have a fever or persistent symptoms for more than 2-3 days.   You have a fever and your symptoms suddenly get worse.  MAKE SURE YOU:    Understand these instructions.   Will watch your condition.   Will get help right away if you are not doing well or get worse.     This information is not intended to replace advice given to you by your health care provider. Make sure you discuss any questions you have with   your health care provider.     Document Released: 08/05/2005 Document Revised: 05/26/2013 Document Reviewed: 03/17/2013  Elsevier Interactive Patient Education 2016 [employer]

## 2016-01-04 NOTE — MAU Provider Note (Signed)
  History     CSN: [id number]  Arrival date and time: 01/04/16 1036   First Provider Initiated Contact with Patient 01/04/16 1353      Chief Complaint  Patient presents with  . Emesis During Pregnancy   HPI Pt presented from the office for NV with pregnancy.  She vomits every half hour and has not been able to keep anything down  Pertinent Gynecological History:   Past Medical History  Diagnosis Date  . GERD (gastroesophageal reflux disease)   . Interstitial cystitis   . Anal fissure 2008    Past Surgical History  Procedure Laterality Date  . Cesarean section    . Endometrial ablation      removal of scar tissue  . Cholecystectomy      Family History  Problem Relation Age of Onset  . Colon cancer Maternal Grandmother   . Colon polyps Maternal Grandmother   . Breast cancer Paternal Grandmother   . Esophageal cancer Neg Hx   . Rectal cancer Neg Hx   . Stomach cancer Neg Hx     Social History  Substance Use Topics  . Smoking status: Never Smoker   . Smokeless tobacco: Never Used  . Alcohol Use: 0.0 oz/week    0 Standard drinks or equivalent per week     Comment: rarely    Allergies: No Known Allergies  Prescriptions prior to admission  Medication Sig Dispense Refill Last Dose  . acetaminophen (TYLENOL) 325 MG tablet Take 650 mg by mouth every 6 (six) hours as needed for mild pain or headache.   Past Week at Unknown time  . Prenatal Vit-Fe Fumarate-FA (PRENATAL MULTIVITAMIN) TABS tablet Take 1 tablet by mouth daily at 12 noon.   Past Week at Unknown time  . [DISCONTINUED] omeprazole (PRILOSEC) 40 MG capsule Take 1 capsule (40 mg total) by mouth daily. 90 capsule 3 05/22/2015    ROS Physical Exam   Blood pressure 125/78, pulse 68, temperature 98.2 F (36.8 C), temperature source Oral, resp. rate 18.  Physical Exam  Physical Examination: General appearance - alert, well appearing, and in no distress Mental status - alert, oriented to person, place, and  time Physical Examination: Abdomen - soft, nontender, nondistended, no masses or organomegaly Extremities - peripheral pulses normal, no pedal edema, no clubbing or cyanosis  MAU Course  Procedures  MDM  Assessment and Plan  NV OF PREGNANCY PT DOING BETTER WITH IVF AND ZOFRAN EAT BRAT DIET ZOFRAN PRN STOOL SOFTENER WHILE ON ZOFRAN US S=D.  WITH [facility]  Casey Oneill A 01/04/2016, 2:00 PM

## 2016-03-01 ENCOUNTER — Other Ambulatory Visit (HOSPITAL_COMMUNITY): Payer: Self-pay | Admitting: Obstetrics and Gynecology

## 2016-03-01 DIAGNOSIS — O359XX0 Maternal care for (suspected) fetal abnormality and damage, unspecified, not applicable or unspecified: Secondary | ICD-10-CM

## 2016-03-01 DIAGNOSIS — Z3689 Encounter for other specified antenatal screening: Secondary | ICD-10-CM

## 2016-03-15 ENCOUNTER — Ambulatory Visit (HOSPITAL_COMMUNITY): Payer: Federal, State, Local not specified - PPO

## 2016-03-15 ENCOUNTER — Encounter (HOSPITAL_COMMUNITY): Payer: Self-pay

## 2016-03-18 ENCOUNTER — Ambulatory Visit (HOSPITAL_COMMUNITY)
Admission: RE | Admit: 2016-03-18 | Discharge: 2016-03-18 | Disposition: A | Discharge status: Home / Self Care | Payer: Federal, State, Local not specified - PPO | Source: Ambulatory Visit | Attending: Obstetrics and Gynecology | Admitting: Obstetrics and Gynecology

## 2016-03-18 ENCOUNTER — Encounter (HOSPITAL_COMMUNITY): Payer: Self-pay

## 2016-03-18 ENCOUNTER — Other Ambulatory Visit (HOSPITAL_COMMUNITY): Payer: Self-pay | Admitting: Obstetrics and Gynecology

## 2016-03-18 ENCOUNTER — Other Ambulatory Visit (HOSPITAL_COMMUNITY): Payer: Self-pay | Admitting: Maternal and Fetal Medicine

## 2016-03-18 DIAGNOSIS — O34219 Maternal care for unspecified type scar from previous cesarean delivery: Secondary | ICD-10-CM

## 2016-03-18 DIAGNOSIS — O09522 Supervision of elderly multigravida, second trimester: Secondary | ICD-10-CM | POA: Insufficient documentation

## 2016-03-18 DIAGNOSIS — O4402 Placenta previa specified as without hemorrhage, second trimester: Secondary | ICD-10-CM

## 2016-03-18 DIAGNOSIS — Z3A23 23 weeks gestation of pregnancy: Secondary | ICD-10-CM

## 2016-03-18 DIAGNOSIS — O359XX0 Maternal care for (suspected) fetal abnormality and damage, unspecified, not applicable or unspecified: Secondary | ICD-10-CM

## 2016-03-18 DIAGNOSIS — Z36 Encounter for antenatal screening of mother: Secondary | ICD-10-CM | POA: Diagnosis not present

## 2016-03-18 DIAGNOSIS — IMO0002 Reserved for concepts with insufficient information to code with codable children: Secondary | ICD-10-CM

## 2016-03-18 DIAGNOSIS — Z3A27 27 weeks gestation of pregnancy: Secondary | ICD-10-CM

## 2016-03-18 DIAGNOSIS — Z0489 Encounter for examination and observation for other specified reasons: Secondary | ICD-10-CM

## 2016-03-29 ENCOUNTER — Inpatient Hospital Stay (HOSPITAL_COMMUNITY): Payer: Federal, State, Local not specified - PPO

## 2016-03-29 ENCOUNTER — Encounter (HOSPITAL_COMMUNITY): Payer: Self-pay | Admitting: *Deleted

## 2016-03-29 ENCOUNTER — Inpatient Hospital Stay (HOSPITAL_COMMUNITY)
Admission: AD | Admit: 2016-03-29 | Discharge: 2016-03-29 | Disposition: A | Discharge status: Home / Self Care | Payer: Federal, State, Local not specified - PPO | Source: Ambulatory Visit | Attending: Obstetrics and Gynecology | Admitting: Obstetrics and Gynecology

## 2016-03-29 DIAGNOSIS — R109 Unspecified abdominal pain: Secondary | ICD-10-CM | POA: Diagnosis present

## 2016-03-29 DIAGNOSIS — Z3A27 27 weeks gestation of pregnancy: Secondary | ICD-10-CM

## 2016-03-29 DIAGNOSIS — T1490 Injury, unspecified: Secondary | ICD-10-CM

## 2016-03-29 DIAGNOSIS — O09522 Supervision of elderly multigravida, second trimester: Secondary | ICD-10-CM

## 2016-03-29 DIAGNOSIS — W19XXXA Unspecified fall, initial encounter: Secondary | ICD-10-CM | POA: Insufficient documentation

## 2016-03-29 DIAGNOSIS — Z3A24 24 weeks gestation of pregnancy: Secondary | ICD-10-CM | POA: Diagnosis not present

## 2016-03-29 DIAGNOSIS — K219 Gastro-esophageal reflux disease without esophagitis: Secondary | ICD-10-CM | POA: Insufficient documentation

## 2016-03-29 DIAGNOSIS — O26892 Other specified pregnancy related conditions, second trimester: Secondary | ICD-10-CM | POA: Insufficient documentation

## 2016-03-29 DIAGNOSIS — Z0489 Encounter for examination and observation for other specified reasons: Secondary | ICD-10-CM

## 2016-03-29 DIAGNOSIS — O4402 Placenta previa specified as without hemorrhage, second trimester: Secondary | ICD-10-CM | POA: Diagnosis not present

## 2016-03-29 DIAGNOSIS — O9A219 Injury, poisoning and certain other consequences of external causes complicating pregnancy, unspecified trimester: Secondary | ICD-10-CM | POA: Diagnosis not present

## 2016-03-29 DIAGNOSIS — O4400 Placenta previa specified as without hemorrhage, unspecified trimester: Secondary | ICD-10-CM

## 2016-03-29 DIAGNOSIS — IMO0002 Reserved for concepts with insufficient information to code with codable children: Secondary | ICD-10-CM

## 2016-03-29 DIAGNOSIS — O34219 Maternal care for unspecified type scar from previous cesarean delivery: Secondary | ICD-10-CM

## 2016-03-29 DIAGNOSIS — W108XXA Fall (on) (from) other stairs and steps, initial encounter: Secondary | ICD-10-CM

## 2016-03-29 DIAGNOSIS — O9989 Other specified diseases and conditions complicating pregnancy, childbirth and the puerperium: Secondary | ICD-10-CM

## 2016-03-29 LAB — URINALYSIS, ROUTINE W REFLEX MICROSCOPIC
BILIRUBIN URINE: NEGATIVE
GLUCOSE, UA: 100 mg/dL — AB
HGB URINE DIPSTICK: NEGATIVE
Ketones, ur: NEGATIVE mg/dL
Leukocytes, UA: NEGATIVE
Nitrite: NEGATIVE
PH: 6.5 (ref 5.0–8.0)
Protein, ur: NEGATIVE mg/dL
SPECIFIC GRAVITY, URINE: 1.02 (ref 1.005–1.030)

## 2016-03-29 NOTE — MAU Provider Note (Signed)
Chief Complaint:  Fall   First Provider Initiated Contact with Patient 03/29/16 1321      Casey Oneill is a 40 y.o. G3P1011 at 19w5dwho presents to maternity admissions reporting soreness after fall yesterday.   Was going up 3 stairs, tripped and fell onto box she was carrying.  No overt pain, "just kind of sore".  Has known previa, has had no bleeding.. She reports good fetal movement, denies LOF, vaginal bleeding, vaginal itching/burning, urinary symptoms, h/a, dizziness, n/v, diarrhea, constipation or fever/chills.    Abdominal Pain  This is a new problem. The current episode started yesterday. The onset quality is gradual. The problem occurs intermittently. The problem has been unchanged. The pain is located in the epigastric region. The pain is mild. The quality of the pain is aching. The abdominal pain does not radiate. Pertinent negatives include no anorexia, constipation, diarrhea, dysuria, fever, headaches, myalgias, nausea or vomiting. Nothing aggravates the pain. The pain is relieved by nothing. She has tried nothing for the symptoms.   RN Note: Patient fell up steps that lead into her house, was carrying a small wooden box hit her stomach, right leg has abrasion. Patient having abdominal pain. No vaginal bleeding positive FM, has placental previa  Past Medical History: Past Medical History:  Diagnosis Date  . Anal fissure 2008  . GERD (gastroesophageal reflux disease)   . Interstitial cystitis     Past obstetric history: OB History  Gravida Para Term Preterm AB Living  SAB TAB Ectopic Multiple Live Births  1            # Outcome Date GA Lbr Len/2nd Weight Sex Delivery Anes PTL Lv  3 Current           2 SAB           1 Term               Past Surgical History: Past Surgical History:  Procedure Laterality Date  . CESAREAN SECTION    . CHOLECYSTECTOMY    . ENDOMETRIAL ABLATION     removal of scar tissue    Family History: Family History  Problem  Relation Age of Onset  . Colon cancer Maternal Grandmother   . Colon polyps Maternal Grandmother   . Breast cancer Paternal Grandmother   . Esophageal cancer Neg Hx   . Rectal cancer Neg Hx   . Stomach cancer Neg Hx     Social History: Social History  Substance Use Topics  . Smoking status: Never Smoker  . Smokeless tobacco: Never Used  . Alcohol use 0.0 oz/week     Comment: rarely    Allergies: No Known Allergies  Meds:  Prescriptions Prior to Admission  Medication Sig Dispense Refill Last Dose  . acetaminophen (TYLENOL) 325 MG tablet Take 650 mg by mouth every 6 (six) hours as needed for mild pain or headache.   Taking  . ondansetron (ZOFRAN) 4 MG tablet Take 1 tablet (4 mg total) by mouth every 8 (eight) hours as needed for nausea or vomiting. 20 tablet 0 Taking  . Prenatal Vit-Fe Fumarate-FA (PRENATAL MULTIVITAMIN) TABS tablet Take 1 tablet by mouth daily at 12 noon.   Taking    I have reviewed patient's Past Medical Hx, Surgical Hx, Family Hx, Social Hx, medications and allergies.   ROS:  Review of Systems  Constitutional: Negative for fever.  Gastrointestinal: Positive for abdominal pain. Negative for anorexia, constipation, diarrhea, nausea and  vomiting.  Genitourinary: Negative for dysuria.  Musculoskeletal: Negative for myalgias.  Neurological: Negative for headaches.   Other systems negative  Physical Exam  Patient Vitals for the past 24 hrs:  BP Temp Pulse Resp Height Weight  03/29/16 1307 115/78 98.4 F (36.9 C) 107 16 - -  03/29/16 1306 - - - - 5' (1.524 m) -  03/29/16 1303 - - - - 5' (1.524 m) 193 lb (87.5 kg)   Constitutional: Well-developed, well-nourished female in no acute distress.  Cardiovascular: normal rate and rhythm Respiratory: normal effort, clear to auscultation bilaterally GI: Abd soft, non-tender, gravid appropriate for gestational age.   No rebound or guarding. MS: Extremities nontender, no edema, normal ROM Neurologic: Alert and  oriented x 4.  GU: Neg CVAT.  Cervix exam deferred due to known previa  PELVIC EXAM: Cervix pink, visually closed, without lesion, scant white creamy discharge, vaginal walls and external genitalia normal Bimanual exam: Cervix firm, posterior, neg CMT, uterus nontender, Fundal Height consistent with dates, adnexa without tenderness, enlargement, or mass  FHT:  Baseline 150 , moderate variability, 10 beat accelerations present, no decelerations Contractions:  Rare   Labs: No results found for this or any previous visit (from the past 24 hour(s)).    Imaging:  Anterior previa Marginal cord insertion No evidence of abruption  MAU Course/MDM: NST reviewed Consult Dr Stefano GaulStringer with presentation, exam findings and test results.  Treatments in MAU included Ultrasound.    Assessment: SiUP at 6455w5d S/P fall yesterday No evidence of abruption  Plan: Discharge home Preterm Labor precautions and fetal kick counts Follow up in Office for prenatal visits and recheck of fetal status   Pt stable at time of discharge.  Wynelle BourgeoisMarie Dechelle Attaway CNM, MSN Certified Nurse-Midwife 03/29/2016 1:21 PM

## 2016-03-29 NOTE — Discharge Instructions (Signed)
Placenta Previa Placenta previa is a condition in pregnant women where the placenta implants in the lower part of the uterus. The placenta either partially or completely covers the opening to the cervix. This is a problem because the baby must pass through the cervix during delivery. There are three types of placenta previa. They include:  1. Marginal placenta previa. The placenta is near the cervix, but does not cover the opening. 2. Partial placenta previa. The placenta covers part of the cervical opening. 3. Complete placenta previa. The placenta covers the entire cervical opening.  Depending on the type of placenta previa, there is a chance the placenta may move into a normal position and no longer cover the cervix as the pregnancy progresses. It is important to keep all prenatal visits with your caregiver.  RISK FACTORS You may be more likely to develop placenta previa if you:   Are carrying more than one baby (multiples).   Have an abnormally shaped uterus.   Have scars on the lining of the uterus.   Had previous surgeries involving the uterus, such as a cesarean delivery.   Have delivered a baby previously.   Have a history of placenta previa.   Have smoked or used cocaine during pregnancy.   Are age 41 or older during pregnancy.  SYMPTOMS The main symptom of placenta previa is sudden, painless vaginal bleeding during the second half of pregnancy. The amount of bleeding can be light to very heavy. The bleeding may stop on its own, but almost always returns. Cramping, regular contractions, abdominal pain, and lower back pain can also occur with placenta previa.  DIAGNOSIS Placenta previa can be diagnosed through an ultrasound by finding where the placenta is located. The ultrasound may find placenta previa either during a routine prenatal visit or after vaginal bleeding is noticed. If you are diagnosed with placenta previa, your caregiver may avoid vaginal exams to reduce  the risk of heavy bleeding. There is a chance that placenta previa may not be diagnosed until bleeding occurs during labor.  TREATMENT Specific treatment depends on:   How much you are bleeding or if the bleeding has stopped.  How far along you are in your pregnancy.   The condition of the baby.   The location of the baby and placenta.   The type of placenta previa.  Depending on the factors above, your caregiver may recommend:   Decreased activity.   Bed rest at home or in the hospital.  Pelvic rest. This means no sex, using tampons, douching, pelvic exams, or placing anything into the vagina.  A blood transfusion to replace maternal blood loss.  A cesarean delivery if the bleeding is heavy and cannot be controlled or the placenta completely covers the cervix.  Medication to stop premature labor or mature the fetal lungs if delivery is needed before the pregnancy is full term.  WHEN SHOULD YOU SEEK IMMEDIATE MEDICAL CARE IF YOU ARE SENT HOME WITH PLACENTA PREVIA? Seek immediate medical care if you show any symptoms of placenta previa. You will need to go to the hospital to get checked immediately. Again, those symptoms are:  Sudden, painless vaginal bleeding, even a small amount.  Cramping or regular contractions.  Lower back or abdominal pain.   This information is not intended to replace advice given to you by your health care provider. Make sure you discuss any questions you have with your health care provider.   Document Released: 08/05/2005 Document Revised: 08/26/2014 Document Reviewed: 11/06/2012 Elsevier  Interactive Patient Education 2016 [employer]  Pelvic Rest Pelvic rest is sometimes recommended for women when:   The placenta is partially or completely covering the opening of the cervix (placenta previa).  There is bleeding between the uterine wall and the amniotic sac in the first trimester (subchorionic hemorrhage).  The cervix begins to open  without labor starting (incompetent cervix, cervical insufficiency).  The labor is too early (preterm labor). HOME CARE INSTRUCTIONS  Do not have sexual intercourse, stimulation, or an orgasm.  Do not use tampons, douche, or put anything in the vagina.  Do not lift anything over 10 pounds (4.5 kg).  Avoid strenuous activity or straining your pelvic muscles. SEEK MEDICAL CARE IF:  You have any vaginal bleeding during pregnancy. Treat this as a potential emergency.  You have cramping pain felt low in the stomach (stronger than menstrual cramps).  You notice vaginal discharge (watery, mucus, or bloody).  You have a low, dull backache.  There are regular contractions or uterine tightening. SEEK IMMEDIATE MEDICAL CARE IF: You have vaginal bleeding and have placenta previa.    This information is not intended to replace advice given to you by your health care provider. Make sure you discuss any questions you have with your health care provider.   Document Released: 11/30/2010 Document Revised: 10/28/2011 Document Reviewed: 02/06/2015 Elsevier Interactive Patient Education 2016 [employer]. Fall Prevention in the Home  Falls can cause injuries and can affect people from all age groups. There are many simple things that you can do to make your home safe and to help prevent falls. WHAT CAN I DO ON THE OUTSIDE OF MY HOME?  Regularly repair the edges of walkways and driveways and fix any cracks.  Remove high doorway thresholds.  Trim any shrubbery on the main path into your home.  Use bright outdoor lighting.  Clear walkways of debris and clutter, including tools and rocks.  Regularly check that handrails are securely fastened and in good repair. Both sides of any steps should have handrails.  Install guardrails along the edges of any raised decks or porches.  Have leaves, snow, and ice cleared regularly.  Use sand or salt on walkways during winter months.  In the garage,  clean up any spills right away, including grease or oil spills. WHAT CAN I DO IN THE BATHROOM?  Use night lights.  Install grab bars by the toilet and in the tub and shower. Do not use towel bars as grab bars.  Use non-skid mats or decals on the floor of the tub or shower.  If you need to sit down while you are in the shower, use a plastic, non-slip stool.[employer]  Keep the floor dry. Immediately clean up any water that spills on the floor.  Remove soap buildup in the tub or shower on a regular basis.  Attach bath mats securely with double-sided non-slip rug tape.  Remove throw rugs and other tripping hazards from the floor. WHAT CAN I DO IN THE BEDROOM?  Use night lights.  Make sure that a bedside light is easy to reach.  Do not use oversized bedding that drapes onto the floor.  Have a firm chair that has side arms to use for getting dressed.  Remove throw rugs and other tripping hazards from the floor. WHAT CAN I DO IN THE KITCHEN?   Clean up any spills right away.  Avoid walking on wet floors.  Place frequently used items in easy-to-reach places.  If you need to reach  for something above you, use a sturdy step stool that has a grab bar.  Keep electrical cables out of the way.  Do not use floor polish or wax that makes floors slippery. If you have to use wax, make sure that it is non-skid floor wax.  Remove throw rugs and other tripping hazards from the floor. WHAT CAN I DO IN THE STAIRWAYS?  Do not leave any items on the stairs.  Make sure that there are handrails on both sides of the stairs. Fix handrails that are broken or loose. Make sure that handrails are as long as the stairways.  Check any carpeting to make sure that it is firmly attached to the stairs. Fix any carpet that is loose or worn.  Avoid having throw rugs at the top or bottom of stairways, or secure the rugs with carpet tape to prevent them from moving.  Make sure that you have a light switch at the  top of the stairs and the bottom of the stairs. If you do not have them, have them installed. WHAT ARE SOME OTHER FALL PREVENTION TIPS?  Wear closed-toe shoes that fit well and support your feet. Wear shoes that have rubber soles or low heels.  When you use a stepladder, make sure that it is completely opened and that the sides are firmly locked. Have someone hold the ladder while you are using it. Do not climb a closed stepladder.  Add color or contrast paint or tape to grab bars and handrails in your home. Place contrasting color strips on the first and last steps.  Use mobility aids as needed, such as canes, walkers, scooters, and crutches.  Turn on lights if it is dark. Replace any light bulbs that burn out.  Set up furniture so that there are clear paths. Keep the furniture in the same spot.  Fix any uneven floor surfaces.  Choose a carpet design that does not hide the edge of steps of a stairway.  Be aware of any and all pets.  Review your medicines with your healthcare provider. Some medicines can cause dizziness or changes in blood pressure, which increase your risk of falling. Talk with your health care provider about other ways that you can decrease your risk of falls. This may include working with a physical therapist or trainer to improve your strength, balance, and endurance.   This information is not intended to replace advice given to you by your health care provider. Make sure you discuss any questions you have with your health care provider.   Document Released: 07/26/2002 Document Revised: 12/20/2014 Document Reviewed: 09/09/2014 Elsevier Interactive Patient Education [employer]

## 2016-03-29 NOTE — MAU Note (Signed)
Patient fell up steps that lead into her house, was carrying a small wooden box hit her stomach, right leg has abrasion. Patient having abdominal pain. No vaginal bleeding positive FM, has placental previa.

## 2016-04-15 ENCOUNTER — Other Ambulatory Visit (HOSPITAL_COMMUNITY): Payer: Self-pay | Admitting: Maternal and Fetal Medicine

## 2016-04-15 ENCOUNTER — Ambulatory Visit (HOSPITAL_COMMUNITY)
Admission: RE | Admit: 2016-04-15 | Discharge: 2016-04-15 | Disposition: A | Discharge status: Home / Self Care | Payer: Federal, State, Local not specified - PPO | Source: Ambulatory Visit | Attending: Obstetrics and Gynecology | Admitting: Obstetrics and Gynecology

## 2016-04-15 ENCOUNTER — Encounter (HOSPITAL_COMMUNITY): Payer: Self-pay

## 2016-04-15 DIAGNOSIS — O09522 Supervision of elderly multigravida, second trimester: Secondary | ICD-10-CM

## 2016-04-15 DIAGNOSIS — O4402 Placenta previa specified as without hemorrhage, second trimester: Secondary | ICD-10-CM

## 2016-04-15 DIAGNOSIS — Z3A27 27 weeks gestation of pregnancy: Secondary | ICD-10-CM

## 2016-04-15 DIAGNOSIS — O34219 Maternal care for unspecified type scar from previous cesarean delivery: Secondary | ICD-10-CM

## 2016-04-15 DIAGNOSIS — Z36 Encounter for antenatal screening of mother: Secondary | ICD-10-CM | POA: Insufficient documentation

## 2016-04-15 DIAGNOSIS — Z0489 Encounter for examination and observation for other specified reasons: Secondary | ICD-10-CM

## 2016-04-15 DIAGNOSIS — IMO0002 Reserved for concepts with insufficient information to code with codable children: Secondary | ICD-10-CM

## 2016-04-16 ENCOUNTER — Other Ambulatory Visit (HOSPITAL_COMMUNITY): Payer: Self-pay | Admitting: *Deleted

## 2016-04-16 DIAGNOSIS — O4400 Placenta previa specified as without hemorrhage, unspecified trimester: Secondary | ICD-10-CM

## 2016-04-16 DIAGNOSIS — O09529 Supervision of elderly multigravida, unspecified trimester: Secondary | ICD-10-CM

## 2016-05-13 ENCOUNTER — Encounter (HOSPITAL_COMMUNITY): Payer: Self-pay

## 2016-05-13 ENCOUNTER — Ambulatory Visit (HOSPITAL_COMMUNITY)
Admission: RE | Admit: 2016-05-13 | Discharge: 2016-05-13 | Disposition: A | Discharge status: Home / Self Care | Payer: Federal, State, Local not specified - PPO | Source: Ambulatory Visit | Attending: Obstetrics and Gynecology | Admitting: Obstetrics and Gynecology

## 2016-05-13 DIAGNOSIS — O34219 Maternal care for unspecified type scar from previous cesarean delivery: Secondary | ICD-10-CM | POA: Insufficient documentation

## 2016-05-13 DIAGNOSIS — O09523 Supervision of elderly multigravida, third trimester: Secondary | ICD-10-CM | POA: Diagnosis not present

## 2016-05-13 DIAGNOSIS — O4403 Placenta previa specified as without hemorrhage, third trimester: Secondary | ICD-10-CM | POA: Diagnosis not present

## 2016-05-13 DIAGNOSIS — O4400 Placenta previa specified as without hemorrhage, unspecified trimester: Secondary | ICD-10-CM

## 2016-05-13 DIAGNOSIS — Z3A31 31 weeks gestation of pregnancy: Secondary | ICD-10-CM | POA: Diagnosis not present

## 2016-05-13 DIAGNOSIS — O09529 Supervision of elderly multigravida, unspecified trimester: Secondary | ICD-10-CM

## 2016-05-13 LAB — HEPATIC FUNCTION PANEL
ALBUMIN: 3.2 g/dL — AB (ref 3.5–5.0)
ALK PHOS: 61 U/L (ref 38–126)
ALT: 17 U/L (ref 14–54)
AST: 16 U/L (ref 15–41)
Bilirubin, Direct: <0.1 mg/dL — ABNORMAL LOW (ref 0.1–0.5)
TOTAL PROTEIN: 6.4 g/dL — AB (ref 6.5–8.1)
Total Bilirubin: 0.5 mg/dL (ref 0.3–1.2)

## 2016-05-14 LAB — BILE ACIDS, TOTAL: BILE ACIDS TOTAL: 9.4 umol/L (ref 4.7–24.5)

## 2016-05-16 ENCOUNTER — Telehealth (HOSPITAL_COMMUNITY): Payer: Self-pay | Admitting: *Deleted

## 2016-05-16 NOTE — Telephone Encounter (Signed)
Lab results reviewed by Dr. Sherrie Georgeecker. Called pt with results.  Pt continues to have itching with a rash, has been given prednisone.  Pt will follow up with her OB office.

## 2016-11-08 ENCOUNTER — Encounter (HOSPITAL_COMMUNITY): Payer: Self-pay

## 2018-10-29 ENCOUNTER — Encounter: Payer: Federal, State, Local not specified - PPO | Admitting: Diagnostic Neuroimaging

## 2019-02-16 ENCOUNTER — Emergency Department (HOSPITAL_COMMUNITY)
Admission: EM | Admit: 2019-02-16 | Discharge: 2019-02-17 | Disposition: A | Discharge status: Home / Self Care | Payer: Federal, State, Local not specified - PPO | Attending: Emergency Medicine | Admitting: Emergency Medicine

## 2019-02-16 ENCOUNTER — Encounter (HOSPITAL_COMMUNITY): Payer: Self-pay | Admitting: Emergency Medicine

## 2019-02-16 ENCOUNTER — Other Ambulatory Visit: Payer: Self-pay

## 2019-02-16 DIAGNOSIS — R202 Paresthesia of skin: Secondary | ICD-10-CM | POA: Diagnosis not present

## 2019-02-16 DIAGNOSIS — R410 Disorientation, unspecified: Secondary | ICD-10-CM | POA: Diagnosis not present

## 2019-02-16 DIAGNOSIS — R4182 Altered mental status, unspecified: Secondary | ICD-10-CM | POA: Diagnosis present

## 2019-02-16 DIAGNOSIS — R1310 Dysphagia, unspecified: Secondary | ICD-10-CM | POA: Insufficient documentation

## 2019-02-16 DIAGNOSIS — R200 Anesthesia of skin: Secondary | ICD-10-CM | POA: Insufficient documentation

## 2019-02-16 NOTE — ED Notes (Signed)
Bed: WA01 Expected date:  Expected time:  Means of arrival:  Comments: 

## 2019-02-16 NOTE — ED Notes (Signed)
Patient ambulated to restroom and back to stretcher with a steady gait.

## 2019-02-16 NOTE — ED Triage Notes (Signed)
Pt reports feeling increasing numbness in both arms and along with head then has been feeling confused. Pt reports having medications changed a month ago when symptoms began after having labs done.

## 2019-02-16 NOTE — ED Triage Notes (Signed)
Pt was ambulatory into room and no acute neurological symptoms noted.

## 2019-02-17 ENCOUNTER — Encounter (HOSPITAL_COMMUNITY): Payer: Self-pay

## 2019-02-17 ENCOUNTER — Emergency Department (HOSPITAL_COMMUNITY): Payer: Federal, State, Local not specified - PPO

## 2019-02-17 LAB — PHOSPHORUS: Phosphorus: 3.1 mg/dL (ref 2.5–4.6)

## 2019-02-17 LAB — CBC WITH DIFFERENTIAL/PLATELET
Abs Immature Granulocytes: 0.03 10*3/uL (ref 0.00–0.07)
Basophils Absolute: 0.1 10*3/uL (ref 0.0–0.1)
Basophils Relative: 1 %
Eosinophils Absolute: 0.2 10*3/uL (ref 0.0–0.5)
Eosinophils Relative: 3 %
HCT: 37.5 % (ref 36.0–46.0)
Hemoglobin: 11.9 g/dL — ABNORMAL LOW (ref 12.0–15.0)
Immature Granulocytes: 0 %
Lymphocytes Relative: 42 %
Lymphs Abs: 3.2 10*3/uL (ref 0.7–4.0)
MCH: 28.5 pg (ref 26.0–34.0)
MCHC: 31.7 g/dL (ref 30.0–36.0)
MCV: 89.7 fL (ref 80.0–100.0)
Monocytes Absolute: 0.6 10*3/uL (ref 0.1–1.0)
Monocytes Relative: 7 %
Neutro Abs: 3.6 10*3/uL (ref 1.7–7.7)
Neutrophils Relative %: 47 %
Platelets: 156 10*3/uL (ref 150–400)
RBC: 4.18 MIL/uL (ref 3.87–5.11)
RDW: 12.9 % (ref 11.5–15.5)
WBC: 7.7 10*3/uL (ref 4.0–10.5)
nRBC: 0 % (ref 0.0–0.2)

## 2019-02-17 LAB — COMPREHENSIVE METABOLIC PANEL
ALT: 20 U/L (ref 0–44)
AST: 20 U/L (ref 15–41)
Albumin: 4.1 g/dL (ref 3.5–5.0)
Alkaline Phosphatase: 36 U/L — ABNORMAL LOW (ref 38–126)
Anion gap: 8 (ref 5–15)
BUN: 14 mg/dL (ref 6–20)
CO2: 22 mmol/L (ref 22–32)
Calcium: 8.9 mg/dL (ref 8.9–10.3)
Chloride: 109 mmol/L (ref 98–111)
Creatinine, Ser: 0.73 mg/dL (ref 0.44–1.00)
GFR calc Af Amer: >60 mL/min (ref >60)
GFR calc non Af Amer: >60 mL/min (ref >60)
Glucose, Bld: 94 mg/dL (ref 70–99)
Potassium: 3.9 mmol/L (ref 3.5–5.1)
Sodium: 139 mmol/L (ref 135–145)
Total Bilirubin: 0.5 mg/dL (ref 0.3–1.2)
Total Protein: 7.2 g/dL (ref 6.5–8.1)

## 2019-02-17 LAB — URINALYSIS, MICROSCOPIC (REFLEX): Bacteria, UA: NONE SEEN

## 2019-02-17 LAB — URINALYSIS, ROUTINE W REFLEX MICROSCOPIC
Bilirubin Urine: NEGATIVE
Glucose, UA: NEGATIVE mg/dL
Ketones, ur: NEGATIVE mg/dL
Nitrite: NEGATIVE
Protein, ur: NEGATIVE mg/dL
Specific Gravity, Urine: 1.003 — ABNORMAL LOW (ref 1.005–1.030)
pH: 8 (ref 5.0–8.0)

## 2019-02-17 LAB — PREGNANCY, URINE: Preg Test, Ur: NEGATIVE

## 2019-02-17 LAB — MAGNESIUM: Magnesium: 2.4 mg/dL (ref 1.7–2.4)

## 2019-02-17 MED ORDER — GADOBUTROL 1 MMOL/ML IV SOLN
7.5000 mL | Freq: Once | INTRAVENOUS | Status: AC | PRN
  Administered 2019-02-17: 7 mL via INTRAVENOUS

## 2019-02-17 NOTE — Discharge Instructions (Signed)
Please follow-up with your PCP first to ensure that your symptoms were not because of medication side effects. If they do not think your symptoms are related to medication side effects, then ensure they gave you neurology follow-up or proceeded with further work-up to figure out what caused your symptoms.

## 2019-02-17 NOTE — ED Notes (Signed)
Attempted an IV x 2 but unsuccessful.  

## 2019-02-17 NOTE — ED Notes (Signed)
Patient ambulated to the RR without assistance.

## 2019-02-17 NOTE — ED Notes (Signed)
Discharge paperwork reviewed with pt, pt verbalized understanding.  NAD, ambulatory at discharge.

## 2019-02-17 NOTE — ED Notes (Addendum)
TeleNeurology machine placed in pt room.

## 2019-02-17 NOTE — Consult Note (Signed)
[facility] Tele[facility] Consult Services  Stat Consult  Date of Service:   02/17/2019 01:46:27  Impression:     .  Rule Out Acute Ischemic Stroke  Comments/Sign-Out: subacute right sided numbness - DDx L parietal/subcortical stroke vs complicated migraine vs cervical myelopathy/MS vs anxiety. Recommend admission for MRI brain/C spine wwo contrast. She is outside the tpa window.  CT HEAD: Not Reviewed  Metrics: [facility] Notification Time: 02/17/2019 01:45:05 Stamp Time: 02/17/2019 01:46:27 Video Start Time: 02/17/2019 01:55:14  Our recommendations are outlined below.  Recommendations:     .  start ASA if CT head is neg for hemorrhage.   Imaging Studies:     .  MRI Head with and Without Contrast     .  MRA Head and Neck Without Contrast When Available - Stroke Protocol  Therapies:     .  Physical Therapy, Occupational Therapy, Speech Therapy Assessment When Applicable  Other WorkUp:     .  Infectious/metabolic workup per primary team  Disposition: Neurology Follow Up Recommended  Sign Out:     .  Discussed with Emergency Department Provider  ----------------------------------------------------------------------------------------------------  Chief Complaint: right arm/leg numbness  History of Present Illness: Patient is a 43 year old Female.  43 yo woman with right sided tingling, involving the right arm and leg, starting 2 weeks ago. It has been coming and going. She also has occasional difficulty concentrating as well. Today, she developed numbness on the right side of her face as well. She was recently started on topamax for weight loss.   Examination: 1A: Level of Consciousness - Alert; keenly responsive + 0 1B: Ask Month and Age - Both Questions Right + 0 1C: Blink Eyes & Squeeze Hands - Performs Both Tasks + 0 2: Test Horizontal Extraocular Movements - Normal + 0 3: Test Visual Fields - No Visual Loss + 0 4: Test Facial Palsy  (Use Grimace if Obtunded) - Normal symmetry + 0 5A: Test Left Arm Motor Drift - No Drift for 10 Seconds + 0 5B: Test Right Arm Motor Drift - No Drift for 10 Seconds + 0 6A: Test Left Leg Motor Drift - No Drift for 5 Seconds + 0 6B: Test Right Leg Motor Drift - No Drift for 5 Seconds + 0 7: Test Limb Ataxia (FNF/Heel-Shin) - No Ataxia + 0 8: Test Sensation - Normal; No sensory loss + 0 9: Test Language/Aphasia - Normal; No aphasia + 0 10: Test Dysarthria - Normal + 0 11: Test Extinction/Inattention - No abnormality + 0  NIHSS Score: 0   Due to the immediate potential for life-threatening deterioration due to underlying acute neurologic illness, I spent 35 minutes providing critical care. This time includes time for face to face visit via telemedicine, review of medical records, imaging studies and discussion of findings with providers, the patient and/or family.   Dr Hal Morales   [facility] [phone]  Case [id number]

## 2019-02-17 NOTE — ED Notes (Signed)
Bed: NI62 Expected date:  Expected time:  Means of arrival:  Comments: 1-8 pt

## 2019-02-17 NOTE — ED Notes (Signed)
Anderson Malta, RN (Camera operator) is going to attempt an IV.

## 2019-02-17 NOTE — ED Provider Notes (Signed)
MRI imaging obtained here in the emergency department without acute abnormality.  Nothing to explain her symptomatology.  Patient will need outpatient primary care and neurology follow-up.  No indication for additional work-up or acute hospitalization at this time.  Patient understands return the emergency department for new or worsening symptoms  Ct Head Wo Contrast  Result Date: 02/17/2019 CLINICAL DATA:  43 y/o F; increasing numbness in the arms, confusion, dysphagia, and medication change 1 month ago. EXAM: CT HEAD WITHOUT CONTRAST TECHNIQUE: Contiguous axial images were obtained from the base of the skull through the vertex without intravenous contrast. COMPARISON:  None. FINDINGS: Brain: No evidence of acute infarction, hemorrhage, hydrocephalus, extra-axial collection or mass lesion/mass effect. Vascular: No hyperdense vessel or unexpected calcification. Skull: Normal. Negative for fracture or focal lesion. Sinuses/Orbits: No acute finding. Other: None. IMPRESSION: Negative CT of the head. Electronically Signed   By: Mitzi HansenLance  Furusawa-Stratton M.D.   On: 02/17/2019 02:52   Mr Angio Head Wo Contrast  Result Date: 02/17/2019 CLINICAL DATA:  Right-sided arm and leg tingling as well as right facial numbness. Difficulty concentrating. Dysphagia. Symptoms beginning after a medication change 1 month ago. EXAM: MRI HEAD WITHOUT AND WITH CONTRAST MRA HEAD WITHOUT CONTRAST MRA NECK WITHOUT AND WITH CONTRAST MRI CERVICAL SPINE WITHOUT AND WITH CONTRAST TECHNIQUE: Multiplanar, multiecho pulse sequences of the brain and surrounding structures, and cervical spine, to include the craniocervical junction and cervicothoracic junction, were obtained without and with intravenous contrast. Angiographic images of the Circle of Willis were obtained using MRA technique without intravenous contrast. Angiographic images of the neck were obtained using MRA technique without and with intravenous contrast. Carotid stenosis  measurements (when applicable) are obtained utilizing NASCET criteria, using the distal internal carotid diameter as the denominator. CONTRAST:  7 mL Gadavist COMPARISON:  Head CT 02/17/2019 FINDINGS: MRI HEAD FINDINGS Brain: There is no evidence of acute infarct, intracranial hemorrhage, mass, midline shift, or extra-axial fluid collection. The ventricles and sulci are normal. The brain is normal in signal. No abnormal enhancement is identified. Vascular: Major intracranial vascular flow voids are preserved. Skull and upper cervical spine: Unremarkable bone marrow signal. Sinuses/Orbits: Unremarkable orbits. Paranasal sinuses and mastoid air cells are clear. Other: None. MRA HEAD FINDINGS The visualized distal vertebral arteries are widely patent to the basilar. A patent right PICA and bilateral SCAs are visualized. A ICAs and a left PICA are not clearly identified. The basilar artery is widely patent. There is a small to moderate-sized left posterior communicating artery without a right posterior communicating artery identified. Both PCAs are patent without evidence of significant stenosis. The internal carotid arteries are widely patent from skull base to carotid termini. Both cavernous segments are tortuous. ACAs and MCAs are patent without evidence of proximal branch occlusion or significant proximal stenosis. The right A1 segment is hypoplastic and the right ACA is predominantly supplied by the left ACA. No aneurysm is identified. MRA NECK FINDINGS There is a standard 3 vessel aortic arch. The brachiocephalic and subclavian arteries are widely patent. The cervical carotid arteries are patent without evidence of stenosis or dissection. The vertebral arteries are patent and codominant with antegrade flow bilaterally and no evidence of significant or dissection. MRI CERVICAL SPINE FINDINGS The axial spin echo T2 sequence is moderately motion degraded, and there is mild motion on other sequences. Alignment:  Normal. Vertebrae: No fracture, suspicious osseous lesion, or significant marrow edema. Cord: Normal signal and morphology.  No abnormal enhancement. Posterior Fossa, vertebral arteries, paraspinal tissues: Unremarkable. Disc levels:  C2-3: Negative. C3-4: A right posterolateral disc protrusion results in mild right neural foraminal stenosis without spinal stenosis. C4-5: Minimal disc bulging without stenosis. C5-6: Mild disc bulging/broad posterior disc protrusion without stenosis. C6-7: Small central disc protrusion without stenosis. C7-T1: Negative. IMPRESSION: 1. Negative head MRI. 2. Negative head and neck MRAs. 3. Mild cervical spondylosis without spinal stenosis. 4. Mild right neural foraminal stenosis at C3-4 due to a disc protrusion. 5. Normal appearance of the cervical spinal cord. Electronically Signed   By: Sebastian AcheAllen  Grady M.D.   On: 02/17/2019 09:15   Mr Angio Neck W Wo Contrast  Result Date: 02/17/2019 CLINICAL DATA:  Right-sided arm and leg tingling as well as right facial numbness. Difficulty concentrating. Dysphagia. Symptoms beginning after a medication change 1 month ago. EXAM: MRI HEAD WITHOUT AND WITH CONTRAST MRA HEAD WITHOUT CONTRAST MRA NECK WITHOUT AND WITH CONTRAST MRI CERVICAL SPINE WITHOUT AND WITH CONTRAST TECHNIQUE: Multiplanar, multiecho pulse sequences of the brain and surrounding structures, and cervical spine, to include the craniocervical junction and cervicothoracic junction, were obtained without and with intravenous contrast. Angiographic images of the Circle of Willis were obtained using MRA technique without intravenous contrast. Angiographic images of the neck were obtained using MRA technique without and with intravenous contrast. Carotid stenosis measurements (when applicable) are obtained utilizing NASCET criteria, using the distal internal carotid diameter as the denominator. CONTRAST:  7 mL Gadavist COMPARISON:  Head CT 02/17/2019 FINDINGS: MRI HEAD FINDINGS Brain: There  is no evidence of acute infarct, intracranial hemorrhage, mass, midline shift, or extra-axial fluid collection. The ventricles and sulci are normal. The brain is normal in signal. No abnormal enhancement is identified. Vascular: Major intracranial vascular flow voids are preserved. Skull and upper cervical spine: Unremarkable bone marrow signal. Sinuses/Orbits: Unremarkable orbits. Paranasal sinuses and mastoid air cells are clear. Other: None. MRA HEAD FINDINGS The visualized distal vertebral arteries are widely patent to the basilar. A patent right PICA and bilateral SCAs are visualized. A ICAs and a left PICA are not clearly identified. The basilar artery is widely patent. There is a small to moderate-sized left posterior communicating artery without a right posterior communicating artery identified. Both PCAs are patent without evidence of significant stenosis. The internal carotid arteries are widely patent from skull base to carotid termini. Both cavernous segments are tortuous. ACAs and MCAs are patent without evidence of proximal branch occlusion or significant proximal stenosis. The right A1 segment is hypoplastic and the right ACA is predominantly supplied by the left ACA. No aneurysm is identified. MRA NECK FINDINGS There is a standard 3 vessel aortic arch. The brachiocephalic and subclavian arteries are widely patent. The cervical carotid arteries are patent without evidence of stenosis or dissection. The vertebral arteries are patent and codominant with antegrade flow bilaterally and no evidence of significant or dissection. MRI CERVICAL SPINE FINDINGS The axial spin echo T2 sequence is moderately motion degraded, and there is mild motion on other sequences. Alignment: Normal. Vertebrae: No fracture, suspicious osseous lesion, or significant marrow edema. Cord: Normal signal and morphology.  No abnormal enhancement. Posterior Fossa, vertebral arteries, paraspinal tissues: Unremarkable. Disc levels:  C2-3: Negative. C3-4: A right posterolateral disc protrusion results in mild right neural foraminal stenosis without spinal stenosis. C4-5: Minimal disc bulging without stenosis. C5-6: Mild disc bulging/broad posterior disc protrusion without stenosis. C6-7: Small central disc protrusion without stenosis. C7-T1: Negative. IMPRESSION: 1. Negative head MRI. 2. Negative head and neck MRAs. 3. Mild cervical spondylosis without spinal stenosis. 4. Mild right neural foraminal  stenosis at C3-4 due to a disc protrusion. 5. Normal appearance of the cervical spinal cord. Electronically Signed   By: Logan Bores M.D.   On: 02/17/2019 09:15   Mr Jeri Cos And Wo Contrast  Result Date: 02/17/2019 CLINICAL DATA:  Right-sided arm and leg tingling as well as right facial numbness. Difficulty concentrating. Dysphagia. Symptoms beginning after a medication change 1 month ago. EXAM: MRI HEAD WITHOUT AND WITH CONTRAST MRA HEAD WITHOUT CONTRAST MRA NECK WITHOUT AND WITH CONTRAST MRI CERVICAL SPINE WITHOUT AND WITH CONTRAST TECHNIQUE: Multiplanar, multiecho pulse sequences of the brain and surrounding structures, and cervical spine, to include the craniocervical junction and cervicothoracic junction, were obtained without and with intravenous contrast. Angiographic images of the Circle of Willis were obtained using MRA technique without intravenous contrast. Angiographic images of the neck were obtained using MRA technique without and with intravenous contrast. Carotid stenosis measurements (when applicable) are obtained utilizing NASCET criteria, using the distal internal carotid diameter as the denominator. CONTRAST:  7 mL Gadavist COMPARISON:  Head CT 02/17/2019 FINDINGS: MRI HEAD FINDINGS Brain: There is no evidence of acute infarct, intracranial hemorrhage, mass, midline shift, or extra-axial fluid collection. The ventricles and sulci are normal. The brain is normal in signal. No abnormal enhancement is identified. Vascular: Major  intracranial vascular flow voids are preserved. Skull and upper cervical spine: Unremarkable bone marrow signal. Sinuses/Orbits: Unremarkable orbits. Paranasal sinuses and mastoid air cells are clear. Other: None. MRA HEAD FINDINGS The visualized distal vertebral arteries are widely patent to the basilar. A patent right PICA and bilateral SCAs are visualized. A ICAs and a left PICA are not clearly identified. The basilar artery is widely patent. There is a small to moderate-sized left posterior communicating artery without a right posterior communicating artery identified. Both PCAs are patent without evidence of significant stenosis. The internal carotid arteries are widely patent from skull base to carotid termini. Both cavernous segments are tortuous. ACAs and MCAs are patent without evidence of proximal branch occlusion or significant proximal stenosis. The right A1 segment is hypoplastic and the right ACA is predominantly supplied by the left ACA. No aneurysm is identified. MRA NECK FINDINGS There is a standard 3 vessel aortic arch. The brachiocephalic and subclavian arteries are widely patent. The cervical carotid arteries are patent without evidence of stenosis or dissection. The vertebral arteries are patent and codominant with antegrade flow bilaterally and no evidence of significant or dissection. MRI CERVICAL SPINE FINDINGS The axial spin echo T2 sequence is moderately motion degraded, and there is mild motion on other sequences. Alignment: Normal. Vertebrae: No fracture, suspicious osseous lesion, or significant marrow edema. Cord: Normal signal and morphology.  No abnormal enhancement. Posterior Fossa, vertebral arteries, paraspinal tissues: Unremarkable. Disc levels: C2-3: Negative. C3-4: A right posterolateral disc protrusion results in mild right neural foraminal stenosis without spinal stenosis. C4-5: Minimal disc bulging without stenosis. C5-6: Mild disc bulging/broad posterior disc protrusion  without stenosis. C6-7: Small central disc protrusion without stenosis. C7-T1: Negative. IMPRESSION: 1. Negative head MRI. 2. Negative head and neck MRAs. 3. Mild cervical spondylosis without spinal stenosis. 4. Mild right neural foraminal stenosis at C3-4 due to a disc protrusion. 5. Normal appearance of the cervical spinal cord. Electronically Signed   By: Logan Bores M.D.   On: 02/17/2019 09:15   Mr Cervical Spine W Or Wo Contrast  Result Date: 02/17/2019 CLINICAL DATA:  Right-sided arm and leg tingling as well as right facial numbness. Difficulty concentrating. Dysphagia. Symptoms beginning after a medication  change 1 month ago. EXAM: MRI HEAD WITHOUT AND WITH CONTRAST MRA HEAD WITHOUT CONTRAST MRA NECK WITHOUT AND WITH CONTRAST MRI CERVICAL SPINE WITHOUT AND WITH CONTRAST TECHNIQUE: Multiplanar, multiecho pulse sequences of the brain and surrounding structures, and cervical spine, to include the craniocervical junction and cervicothoracic junction, were obtained without and with intravenous contrast. Angiographic images of the Circle of Willis were obtained using MRA technique without intravenous contrast. Angiographic images of the neck were obtained using MRA technique without and with intravenous contrast. Carotid stenosis measurements (when applicable) are obtained utilizing NASCET criteria, using the distal internal carotid diameter as the denominator. CONTRAST:  7 mL Gadavist COMPARISON:  Head CT 02/17/2019 FINDINGS: MRI HEAD FINDINGS Brain: There is no evidence of acute infarct, intracranial hemorrhage, mass, midline shift, or extra-axial fluid collection. The ventricles and sulci are normal. The brain is normal in signal. No abnormal enhancement is identified. Vascular: Major intracranial vascular flow voids are preserved. Skull and upper cervical spine: Unremarkable bone marrow signal. Sinuses/Orbits: Unremarkable orbits. Paranasal sinuses and mastoid air cells are clear. Other: None. MRA HEAD  FINDINGS The visualized distal vertebral arteries are widely patent to the basilar. A patent right PICA and bilateral SCAs are visualized. A ICAs and a left PICA are not clearly identified. The basilar artery is widely patent. There is a small to moderate-sized left posterior communicating artery without a right posterior communicating artery identified. Both PCAs are patent without evidence of significant stenosis. The internal carotid arteries are widely patent from skull base to carotid termini. Both cavernous segments are tortuous. ACAs and MCAs are patent without evidence of proximal branch occlusion or significant proximal stenosis. The right A1 segment is hypoplastic and the right ACA is predominantly supplied by the left ACA. No aneurysm is identified. MRA NECK FINDINGS There is a standard 3 vessel aortic arch. The brachiocephalic and subclavian arteries are widely patent. The cervical carotid arteries are patent without evidence of stenosis or dissection. The vertebral arteries are patent and codominant with antegrade flow bilaterally and no evidence of significant or dissection. MRI CERVICAL SPINE FINDINGS The axial spin echo T2 sequence is moderately motion degraded, and there is mild motion on other sequences. Alignment: Normal. Vertebrae: No fracture, suspicious osseous lesion, or significant marrow edema. Cord: Normal signal and morphology.  No abnormal enhancement. Posterior Fossa, vertebral arteries, paraspinal tissues: Unremarkable. Disc levels: C2-3: Negative. C3-4: A right posterolateral disc protrusion results in mild right neural foraminal stenosis without spinal stenosis. C4-5: Minimal disc bulging without stenosis. C5-6: Mild disc bulging/broad posterior disc protrusion without stenosis. C6-7: Small central disc protrusion without stenosis. C7-T1: Negative. IMPRESSION: 1. Negative head MRI. 2. Negative head and neck MRAs. 3. Mild cervical spondylosis without spinal stenosis. 4. Mild right  neural foraminal stenosis at C3-4 due to a disc protrusion. 5. Normal appearance of the cervical spinal cord. Electronically Signed   By: Sebastian AcheAllen  Grady M.D.   On: 02/17/2019 09:15     Azalia Bilisampos, Samarra Ridgely, MD 02/17/19 1001

## 2019-02-17 NOTE — ED Provider Notes (Addendum)
[facility]-EMERGENCY DEPT Provider Note   CSN: [id number] Arrival date & time: 02/16/19  2054    History   Chief Complaint Chief Complaint  Patient presents with   Altered Mental Status    HPI Casey Oneill is a 43 y.o. female.     HPI  43 year old female with history of interstitial cystitis comes in a chief complaint of altered mental status.  She reports that she is taking phentermine for weight loss.  About a month ago she was started on Topamax.  Since the addition of Topamax she has been having episodes of numbness, intermittent episodes of difficulty with fine motor activities, difficulty remembering and confusion.  Today she had an episode where she was having difficulty swallowing, which prompted her to come to the ER.  She states that over the last couple of weeks her symptoms have progressed.  Besides those 2 medications she does not take any other new meds.  Patient denies any heavy smoking, drinking, substance abuse.  There is no family history of any neuromuscular conditions including MS.  She denies any tick exposures or recent travel history.  Past Medical History:  Diagnosis Date   Anal fissure 2008   GERD (gastroesophageal reflux disease)    Interstitial cystitis     There are no active problems to display for this patient.   Past Surgical History:  Procedure Laterality Date   CESAREAN SECTION     CHOLECYSTECTOMY     ENDOMETRIAL ABLATION     removal of scar tissue     OB History    Gravida  3   Para  1   Term  1   Preterm      AB  1   Living  1     SAB  1   TAB      Ectopic      Multiple      Live Births               Home Medications    Prior to Admission medications   Medication Sig Start Date End Date Taking? Authorizing Provider  acetaminophen (TYLENOL) 325 MG tablet Take 650 mg by mouth every 6 (six) hours as needed for mild pain or headache.   Yes [provider]  amitriptyline  (ELAVIL) 10 MG tablet Take 20 mg by mouth at bedtime.   Yes [provider]  cholecalciferol (VITAMIN D3) 25 MCG (1000 UT) tablet Take 1,000 Units by mouth daily.   Yes [provider]  Multiple Vitamin (MULTIVITAMIN WITH MINERALS) TABS tablet Take 1 tablet by mouth daily.   Yes [provider]  phentermine (ADIPEX-P) 37.5 MG tablet Take 37.5 mg by mouth every morning.   Yes [provider]  topiramate (TOPAMAX) 50 MG tablet Take 50 mg by mouth daily.   Yes [provider]    Family History Family History  Problem Relation Age of Onset   Colon cancer Maternal Grandmother    Colon polyps Maternal Grandmother    Breast cancer Paternal Grandmother    Esophageal cancer Neg Hx    Rectal cancer Neg Hx    Stomach cancer Neg Hx     Social History Social History   Tobacco Use   Smoking status: Never Smoker   Smokeless tobacco: Never Used  Substance Use Topics   Alcohol use: Yes    Alcohol/week: 0.0 standard drinks    Comment: rarely   Drug use: No     Allergies  Patient has no known allergies.   Review of Systems Review of Systems  Constitutional: Positive for activity change.  Respiratory: Negative for shortness of breath.   Cardiovascular: Negative for chest pain.  Gastrointestinal: Positive for nausea. Negative for vomiting.  Allergic/Immunologic: Negative for immunocompromised state.  Neurological: Positive for numbness. Negative for headaches.  All other systems reviewed and are negative.    Physical Exam Updated Vital Signs BP 116/75 (BP Location: Left Arm)    Pulse 72    Temp 98.3 F (36.8 C) (Oral)    Resp 12    Ht 5' (1.524 m)    Wt 72.6 kg    LMP 02/15/2019 Comment: negative pregnancy test 02/16/19   SpO2 100%    BMI 31.25 kg/m   Physical Exam Vitals signs and nursing note reviewed.  Constitutional:      Appearance: She is well-developed.  HENT:     Head: Normocephalic and atraumatic.  Neck:      Musculoskeletal: Normal range of motion and neck supple.  Cardiovascular:     Rate and Rhythm: Normal rate.  Pulmonary:     Effort: Pulmonary effort is normal.  Abdominal:     General: Bowel sounds are normal.  Skin:    General: Skin is warm and dry.  Neurological:     Mental Status: She is alert and oriented to person, place, and time.      ED Treatments / Results  Labs (all labs ordered are listed, but only abnormal results are displayed) Labs Reviewed  CBC WITH DIFFERENTIAL/PLATELET - Abnormal; Notable for the following components:      Result Value   Hemoglobin 11.9 (*)    All other components within normal limits  COMPREHENSIVE METABOLIC PANEL - Abnormal; Notable for the following components:   Alkaline Phosphatase 36 (*)    All other components within normal limits  URINALYSIS, ROUTINE W REFLEX MICROSCOPIC - Abnormal; Notable for the following components:   Color, Urine STRAW (*)    Specific Gravity, Urine 1.003 (*)    Hgb urine dipstick LARGE (*)    Leukocytes,Ua TRACE (*)    All other components within normal limits  PREGNANCY, URINE  MAGNESIUM  PHOSPHORUS  URINALYSIS, MICROSCOPIC (REFLEX)    EKG None  Radiology Ct Head Wo Contrast  Result Date: 02/17/2019 CLINICAL DATA:  43 y/o F; increasing numbness in the arms, confusion, dysphagia, and medication change 1 month ago. EXAM: CT HEAD WITHOUT CONTRAST TECHNIQUE: Contiguous axial images were obtained from the base of the skull through the vertex without intravenous contrast. COMPARISON:  None. FINDINGS: Brain: No evidence of acute infarction, hemorrhage, hydrocephalus, extra-axial collection or mass lesion/mass effect. Vascular: No hyperdense vessel or unexpected calcification. Skull: Normal. Negative for fracture or focal lesion. Sinuses/Orbits: No acute finding. Other: None. IMPRESSION: Negative CT of the head. Electronically Signed   By: Mitzi HansenLance  Furusawa-Stratton M.D.   On: 02/17/2019 02:52     Procedures Procedures (including critical care time)  Medications Ordered in ED Medications - No data to display   Initial Impression / Assessment and Plan / ED Course  I have reviewed the triage vital signs and the nursing notes.  Pertinent labs & imaging results that were available during my care of the patient were reviewed by me and considered in my medical decision making (see chart for details).  Clinical Course as of Feb 17 415  Wed Feb 17, 2019  0415 Telemetry neurologist is recommending that patient get MRI brain and MRI cervical spine with and  without contrast. They initially recommended admission, however also informed me that if the MRI is negative then patient can get outpatient work-up for further evaluation of the symptoms   [AN]  0416 Recommendations from the neurologist discussed with the patient.    [AN]    Clinical Course User Index [AN] Varney Biles, MD       43 year old female comes in a chief complaint of confusion, intermittent episodes of numbness, weakness, numbing sensation to her head and an episode of difficulty in swallowing prior to ED arrival.  She is relatively healthy but does take phentermine and Topamax.  On my exam there is no focal abnormalities.  Neuro exam did not reveal any nystagmus and her gross sensory exam and strength are normal.  No nuchal rigidity.  Differential diagnosis would include conditions like MS, medication side effects, cerebral venous thrombosis. It seems like her symptoms have progressed over the past 3 to 5 days.  I doubt that she has subarachnoid hemorrhage or even acute stroke.  History not suggestive of any infectious process or tick exposures.  We will get basic labs and consult telemetry neurology figure out the neck steps.  Final Clinical Impressions(s) / ED Diagnoses   Final diagnoses:  None    ED Discharge Orders    None       Varney Biles, MD 02/17/19 0100    Varney Biles,  MD 02/17/19 [phone]

## 2019-02-22 ENCOUNTER — Ambulatory Visit: Payer: Federal, State, Local not specified - PPO | Admitting: Diagnostic Neuroimaging

## 2019-02-22 ENCOUNTER — Other Ambulatory Visit: Payer: Self-pay

## 2019-02-24 ENCOUNTER — Other Ambulatory Visit: Payer: Self-pay

## 2019-02-24 ENCOUNTER — Ambulatory Visit: Payer: Federal, State, Local not specified - PPO | Admitting: Neurology

## 2019-02-24 ENCOUNTER — Encounter: Payer: Self-pay | Admitting: Neurology

## 2019-02-24 DIAGNOSIS — R200 Anesthesia of skin: Secondary | ICD-10-CM | POA: Diagnosis not present

## 2019-02-24 DIAGNOSIS — G4700 Insomnia, unspecified: Secondary | ICD-10-CM | POA: Diagnosis not present

## 2019-02-24 DIAGNOSIS — G4721 Circadian rhythm sleep disorder, delayed sleep phase type: Secondary | ICD-10-CM | POA: Insufficient documentation

## 2019-02-24 DIAGNOSIS — G5701 Lesion of sciatic nerve, right lower limb: Secondary | ICD-10-CM | POA: Diagnosis not present

## 2019-02-24 DIAGNOSIS — G5621 Lesion of ulnar nerve, right upper limb: Secondary | ICD-10-CM | POA: Insufficient documentation

## 2019-02-24 NOTE — Patient Instructions (Signed)
Two strategies for sleep: 1.   Advance the sleep time by 15 minutes each day  I.e. 2 am to 1:45 am 2.   Push back the sleep time by 2-3 hours each day  I.e. 2 am to 4-5 am  Either way, reinforce your sleep with melatonin 5 mg 90 minutes before bedtime and bright lights shortly after you get up.

## 2019-02-24 NOTE — Progress Notes (Signed)
[facility]  PATIENT: Casey Oneill DOB: 11/12/1975  REFERRING DOCTOR OR PCP:  Courtney Parisachel McCoy; Cory Nafziger SOURCE: Patient, notes from emergency room, imaging and lab reports, imaging studies personally reviewed.  _________________________________   HISTORICAL  CHIEF COMPLAINT:  Chief Complaint  Patient presents with  . New Patient (Initial Visit)    RM 12, alone. [facility] numbness/tingling and dysphagia. [facility]-02/[facility]/19. Throughout June, she had numbness/ting. Progressed over the month. Had trouble swallowing vitamins. She is feeling better since trip to ED.     HISTORY OF PRESENT ILLNESS:  I [facility] the [facility] your patient, Casey [facility]ill[facility] consultation regarding her numbness, tingling and dysphasia.  During June 2020, she started experiencing numbness and tingling on the right side of her body.   On 02/15/2019, she felt strange and had apraxia (too use a small screwdriver).   She also experienced numbness in the mouth on the right and had dysphagia.    She went to the ED and stroke was in the differential diagnosis.   CT head was normal .  She had MRI of the brain, spine and MRA head/neck.   These studies were normal except mild DJD in the cervical spine.     She   Of note, she was started on topiramate and phentermine for weight loss for few weeks before symptoms started.   She stopped the Topamax after the ED visit.    Symptoms are better.  Currently she feels back to baseline.  She is not having any trouble with swallowing.  She continues to have some symptoms that have been persistent for years.   She has a right piriformis syndrome and has right.   She is on Lyrica 50 mg prn for this.   She gets injections by Pain Management (Dr. Retia PasseSaullo).   Additionally, she has had left arm numbness a few years with numbness in the forearm to the fourth and fifth fingers.  She has amitriptyline 10 mg  nightly for insomnia.   Insomnia was better with the combination with topiramate.     She goes to bed at 2-3 am and wakes up at 7 am.   She takes amitriptyline around 10 pm and her goal is to go to sleep around 11 pm but she is not sleepy until later.        Images: 02/16/2019 MRI Brain/Cervical spine/MRA 02/16/2019.  Images were personally reviewed and I concur with the official interpretation. IMPRESSION: 1. Negative head MRI. 2. Negative head and neck MRAs. 3. Mild cervical spondylosis without spinal stenosis. 4. Mild right neural foraminal stenosis at C3-4 due to a disc protrusion. 5. Normal appearance of the cervical spinal cord.   REVIEW OF SYSTEMS: Constitutional: No fevers, chills, sweats, or change in appetite.  Delayed phase sleep syndrome/insomnia Eyes: No visual changes, double vision, eye pain Ear, nose and throat: No hearing loss, ear pain, nasal congestion, sore throat Cardiovascular: No chest pain, palpitations Respiratory: No shortness of breath at rest or with exertion.   No wheezes GastrointestinaI: No nausea, vomiting, diarrhea, abdominal pain, fecal incontinence Genitourinary: No dysuria, urinary retention or frequency.  No nocturia. Musculoskeletal:As above  integumentary: No rash, pruritus, skin lesions Neurological: as above Psychiatric: No depression at this time.  No anxiety Endocrine: No palpitations, diaphoresis, change in appetite, change in weigh or increased thirst Hematologic/Lymphatic: No anemia, purpura, petechiae. Allergic/Immunologic: No itchy/runny eyes, nasal congestion, recent allergic reactions, rashes  ALLERGIES: No  Known Allergies  HOME MEDICATIONS:  Current Outpatient Medications:  .  acetaminophen (TYLENOL) 325 MG tablet, Take 650 mg by mouth every 6 (six) hours as needed for mild pain or headache., Disp: , Rfl:  .  amitriptyline (ELAVIL) 10 MG tablet, Take 20 mg by mouth at bedtime., Disp: , Rfl:  .  cholecalciferol (VITAMIN D3) 25  MCG (1000 UT) tablet, Take 1,000 Units by mouth daily., Disp: , Rfl:  .  Multiple Vitamin (MULTIVITAMIN WITH MINERALS) TABS tablet, Take 1 tablet by mouth daily., Disp: , Rfl:  .  phentermine (ADIPEX-P) 37.5 MG tablet, Take 37.5 mg by mouth every morning., Disp: , Rfl:  .  pregabalin (LYRICA) 50 MG capsule, TAKE 1 CAPSULE BY MOUTH THREE TIMES A DAY, Disp: , Rfl:  .  topiramate (TOPAMAX) 25 MG tablet, TAKE 1 TABLET BY MOUTH EVERYDAY AT BEDTIME, Disp: , Rfl:  .  topiramate (TOPAMAX) 50 MG tablet, Take 50 mg by mouth daily., Disp: , Rfl:   PAST MEDICAL HISTORY: Past Medical History:  Diagnosis Date  . Anal fissure 2008  . GERD (gastroesophageal reflux disease)   . Interstitial cystitis     PAST SURGICAL HISTORY: Past Surgical History:  Procedure Laterality Date  . CESAREAN SECTION    . CHOLECYSTECTOMY    . ENDOMETRIAL ABLATION     removal of scar tissue    FAMILY HISTORY: Family History  Problem Relation Age of Onset  . Colon cancer Maternal Grandmother   . Colon polyps Maternal Grandmother   . Breast cancer Paternal Grandmother   . Esophageal cancer Neg Hx   . Rectal cancer Neg Hx   . Stomach cancer Neg Hx     SOCIAL HISTORY:  Social History   Socioeconomic History  . Marital status: Married    Spouse name: Not on file  . Number of children: 1  . Years of education: Not on file  . Highest education level: Not on file  Occupational History  . Not on file  Social Needs  . Financial resource strain: Not on file  . Food insecurity    Worry: Not on file    Inability: Not on file  . Transportation needs    Medical: Not on file    Non-medical: Not on file  Tobacco Use  . Smoking status: Never Smoker  . Smokeless tobacco: Never Used  Substance and Sexual Activity  . Alcohol use: Not Currently    Alcohol/week: 0.0 standard drinks  . Drug use: No  . Sexual activity: Yes  Lifestyle  . Physical activity    Days per week: Not on file    Minutes per session: Not on  file  . Stress: Not on file  Relationships  . Social Herbalist on phone: Not on file    Gets together: Not on file    Attends religious service: Not on file    Active member of club or organization: Not on file    Attends meetings of clubs or organizations: Not on file    Relationship status: Not on file  . Intimate partner violence    Fear of current or ex partner: Not on file    Emotionally abused: Not on file    Physically abused: Not on file    Forced sexual activity: Not on file  Other Topics Concern  . Not on file  Social History Narrative   Lives with spouse   Right handed    Caffeine use: sweet tea sometimes  PHYSICAL EXAM  Vitals:   02/24/19 0849  BP: 134/82  Pulse: 91  Temp: 97.7 F (36.5 C)  Weight: 164 lb 3.2 oz (74.5 kg)  Height: 5' (1.524 m)    Body mass index is 32.07 kg/m.   General: The patient is well-developed and well-nourished and in no acute distress  HEENT:  Head is [place]/AT.  Sclera are anicteric.     Neck: No carotid bruits are noted.  The neck is nontender.  Skin: Extremities are without rash or  edema.   Neurologic Exam  Mental status: The patient is alert and oriented x 3 at the time of the examination. The patient has apparent normal recent and remote memory, with an apparently normal attention span and concentration ability.   Speech is normal.  Cranial nerves: Extraocular movements are full. Pupils are equal, round, and reactive to light and accomodation.  Visual fields are full.  Facial symmetry is present. There is good facial sensation to soft touch bilaterally.Facial strength is normal.  Trapezius and sternocleidomastoid strength is normal. No dysarthria is noted.  The tongue is midline, and the patient has symmetric elevation of the soft palate. No obvious hearing deficits are noted.  Motor:  Muscle bulk is normal.   Tone is normal. Strength is  5 / 5 in all 4 extremities.   Sensory: Sensory testing is intact to  pinprick, soft touch and vibration sensation in all 4 extremities.  Coordination: Cerebellar testing reveals good finger-nose-finger and heel-to-shin bilaterally.  Gait and station: Station is normal.   Gait is normal. Tandem gait is normal. Romberg is negative.   Reflexes: Deep tendon reflexes are symmetric and normal bilaterally.        DIAGNOSTIC DATA (LABS, IMAGING, TESTING) - I reviewed patient records, labs, notes, testing and imaging myself where available.  Lab Results  Component Value Date   WBC 7.7 02/16/2019   HGB 11.9 (L) 02/16/2019   HCT 37.5 02/16/2019   MCV 89.7 02/16/2019   PLT 156 02/16/2019      Component Value Date/Time   NA 139 02/16/2019 2338   K 3.9 02/16/2019 2338   CL 109 02/16/2019 2338   CO2 22 02/16/2019 2338   GLUCOSE 94 02/16/2019 2338   BUN 14 02/16/2019 2338   CREATININE 0.73 02/16/2019 2338   CALCIUM 8.9 02/16/2019 2338   PROT 7.2 02/16/2019 2338   ALBUMIN 4.1 02/16/2019 2338   AST 20 02/16/2019 2338   ALT 20 02/16/2019 2338   ALKPHOS 36 (L) 02/16/2019 2338   BILITOT 0.5 02/16/2019 2338   GFRNONAA >60 02/16/2019 2338   GFRAA >60 02/16/2019 2338      ASSESSMENT AND PLAN   1. Numbness   2. Delayed sleep phase syndrome   3. Insomnia, unspecified type   4. Piriformis syndrome of right side   5. Ulnar neuropathy at elbow of right upper extremity    In summary, Casey Oneill is a 43 year old woman who had the onset of numbness and tingling on the right and dysphasia.  Symptoms started after starting Topamax and improved after she stopped.  Therefore, that likely contributed to the numbness and dysphasia.  The right side was likely involved more than the left due to her pre-existing ulnar neuropathy and piriformis syndrome.  MRI rules out stroke and multiple sclerosis.  At this point, she is back to baseline so no treatment or further evaluation is needed.  Second problem is delayed phase sleep disorder and insomnia.  I discussed with her  strategy  to try to regulate her sleep and get back to a more standard bedtime and awakening time.  She will either pullback her sleep time 10 to 15 minutes a day until she is at her preferred sleep time (11 PM to midnight) or she will push back her sleep onset time 2 hours a night until she is at her preferred time.  She will reinforce the sleep changes by taking melatonin 90 minutes before bedtime and bright light exposure shortly after waking up.  She will return to see me as needed if she has new or worsening neurologic symptoms.  Thank you for asking me to see Casey Oneill for neurologic consultation.  Please let me know if I can be of further assistance with her or other patients in the future.   Richard A. Epimenio FootSater, MD, [facility],FAAN 02/24/2019, 10:32 AM Certified in Neurology, Clinical Neurophysiology, Sleep Medicine and Neuroimaging  [facility] [address], [address] [place], [place] [address] [phone]) [phone]

## 2019-03-29 ENCOUNTER — Observation Stay (HOSPITAL_COMMUNITY)
Admission: EM | Admit: 2019-03-29 | Discharge: 2019-03-30 | Disposition: A | Discharge status: Home / Self Care | Payer: Federal, State, Local not specified - PPO | Attending: Surgery | Admitting: Surgery

## 2019-03-29 ENCOUNTER — Observation Stay (HOSPITAL_COMMUNITY): Payer: Federal, State, Local not specified - PPO | Admitting: Certified Registered Nurse Anesthetist

## 2019-03-29 ENCOUNTER — Encounter (HOSPITAL_COMMUNITY): Payer: Self-pay | Admitting: Emergency Medicine

## 2019-03-29 ENCOUNTER — Other Ambulatory Visit: Payer: Self-pay

## 2019-03-29 ENCOUNTER — Encounter (HOSPITAL_COMMUNITY)
Admission: EM | Disposition: A | Discharge status: Home / Self Care | Payer: Self-pay | Source: Home / Self Care | Attending: Emergency Medicine

## 2019-03-29 ENCOUNTER — Emergency Department (HOSPITAL_COMMUNITY): Payer: Federal, State, Local not specified - PPO

## 2019-03-29 DIAGNOSIS — Z803 Family history of malignant neoplasm of breast: Secondary | ICD-10-CM | POA: Insufficient documentation

## 2019-03-29 DIAGNOSIS — G5701 Lesion of sciatic nerve, right lower limb: Secondary | ICD-10-CM | POA: Diagnosis not present

## 2019-03-29 DIAGNOSIS — Z20828 Contact with and (suspected) exposure to other viral communicable diseases: Secondary | ICD-10-CM | POA: Insufficient documentation

## 2019-03-29 DIAGNOSIS — K381 Appendicular concretions: Secondary | ICD-10-CM | POA: Diagnosis not present

## 2019-03-29 DIAGNOSIS — Z87448 Personal history of other diseases of urinary system: Secondary | ICD-10-CM | POA: Insufficient documentation

## 2019-03-29 DIAGNOSIS — G709 Myoneural disorder, unspecified: Secondary | ICD-10-CM | POA: Insufficient documentation

## 2019-03-29 DIAGNOSIS — K219 Gastro-esophageal reflux disease without esophagitis: Secondary | ICD-10-CM | POA: Insufficient documentation

## 2019-03-29 DIAGNOSIS — K3580 Unspecified acute appendicitis: Secondary | ICD-10-CM | POA: Diagnosis not present

## 2019-03-29 DIAGNOSIS — Z800 Family history of malignant neoplasm of digestive organs: Secondary | ICD-10-CM | POA: Insufficient documentation

## 2019-03-29 DIAGNOSIS — Z79899 Other long term (current) drug therapy: Secondary | ICD-10-CM | POA: Insufficient documentation

## 2019-03-29 DIAGNOSIS — Z9049 Acquired absence of other specified parts of digestive tract: Secondary | ICD-10-CM | POA: Insufficient documentation

## 2019-03-29 DIAGNOSIS — K5900 Constipation, unspecified: Secondary | ICD-10-CM | POA: Diagnosis not present

## 2019-03-29 DIAGNOSIS — R000 Tachycardia, unspecified: Secondary | ICD-10-CM | POA: Diagnosis not present

## 2019-03-29 DIAGNOSIS — Z8371 Family history of colonic polyps: Secondary | ICD-10-CM | POA: Diagnosis not present

## 2019-03-29 HISTORY — PX: LAPAROSCOPIC APPENDECTOMY: SHX408

## 2019-03-29 LAB — COMPREHENSIVE METABOLIC PANEL
ALT: 27 U/L (ref 0–44)
AST: 24 U/L (ref 15–41)
Albumin: 4.4 g/dL (ref 3.5–5.0)
Alkaline Phosphatase: 46 U/L (ref 38–126)
Anion gap: 12 (ref 5–15)
BUN: 10 mg/dL (ref 6–20)
CO2: 23 mmol/L (ref 22–32)
Calcium: 9.1 mg/dL (ref 8.9–10.3)
Chloride: 101 mmol/L (ref 98–111)
Creatinine, Ser: 0.65 mg/dL (ref 0.44–1.00)
GFR calc Af Amer: >60 mL/min (ref >60)
GFR calc non Af Amer: >60 mL/min (ref >60)
Glucose, Bld: 125 mg/dL — ABNORMAL HIGH (ref 70–99)
Potassium: 3.6 mmol/L (ref 3.5–5.1)
Sodium: 136 mmol/L (ref 135–145)
Total Bilirubin: 1.1 mg/dL (ref 0.3–1.2)
Total Protein: 8.2 g/dL — ABNORMAL HIGH (ref 6.5–8.1)

## 2019-03-29 LAB — CBC WITH DIFFERENTIAL/PLATELET
Abs Immature Granulocytes: 0.09 10*3/uL — ABNORMAL HIGH (ref 0.00–0.07)
Basophils Absolute: 0.1 10*3/uL (ref 0.0–0.1)
Basophils Relative: 0 %
Eosinophils Absolute: 0 10*3/uL (ref 0.0–0.5)
Eosinophils Relative: 0 %
HCT: 40.8 % (ref 36.0–46.0)
Hemoglobin: 13.2 g/dL (ref 12.0–15.0)
Immature Granulocytes: 1 %
Lymphocytes Relative: 5 %
Lymphs Abs: 1 10*3/uL (ref 0.7–4.0)
MCH: 28.8 pg (ref 26.0–34.0)
MCHC: 32.4 g/dL (ref 30.0–36.0)
MCV: 89.1 fL (ref 80.0–100.0)
Monocytes Absolute: 1.4 10*3/uL — ABNORMAL HIGH (ref 0.1–1.0)
Monocytes Relative: 7 %
Neutro Abs: 17.2 10*3/uL — ABNORMAL HIGH (ref 1.7–7.7)
Neutrophils Relative %: 87 %
Platelets: UNDETERMINED 10*3/uL (ref 150–400)
RBC: 4.58 MIL/uL (ref 3.87–5.11)
RDW: 12.9 % (ref 11.5–15.5)
WBC: 19.7 10*3/uL — ABNORMAL HIGH (ref 4.0–10.5)
nRBC: 0 % (ref 0.0–0.2)

## 2019-03-29 LAB — SARS CORONAVIRUS 2 BY RT PCR (HOSPITAL ORDER, PERFORMED IN ~~LOC~~ HOSPITAL LAB): SARS Coronavirus 2: NEGATIVE

## 2019-03-29 LAB — URINALYSIS, ROUTINE W REFLEX MICROSCOPIC
Bilirubin Urine: NEGATIVE
Glucose, UA: NEGATIVE mg/dL
Hgb urine dipstick: NEGATIVE
Ketones, ur: 20 mg/dL — AB
Leukocytes,Ua: NEGATIVE
Nitrite: NEGATIVE
Protein, ur: NEGATIVE mg/dL
Specific Gravity, Urine: 1.014 (ref 1.005–1.030)
pH: 8 (ref 5.0–8.0)

## 2019-03-29 LAB — POC URINE PREG, ED: Preg Test, Ur: NEGATIVE

## 2019-03-29 LAB — TYPE AND SCREEN
ABO/RH(D): A POS
Antibody Screen: NEGATIVE

## 2019-03-29 LAB — LIPASE, BLOOD: Lipase: 29 U/L (ref 11–51)

## 2019-03-29 SURGERY — APPENDECTOMY, LAPAROSCOPIC
Anesthesia: General | Site: Abdomen

## 2019-03-29 MED ORDER — PHENYLEPHRINE 40 MCG/ML (10ML) SYRINGE FOR IV PUSH (FOR BLOOD PRESSURE SUPPORT)
PREFILLED_SYRINGE | INTRAVENOUS | Status: AC
  Filled 2019-03-29: qty 10

## 2019-03-29 MED ORDER — LACTATED RINGERS IV SOLN
INTRAVENOUS | Status: AC | PRN
  Administered 2019-03-29: 1000 mL

## 2019-03-29 MED ORDER — HYDROMORPHONE HCL 1 MG/ML IJ SOLN: 1.0000 mg | INTRAMUSCULAR | Status: DC | PRN

## 2019-03-29 MED ORDER — MORPHINE SULFATE (PF) 2 MG/ML IV SOLN
2.0000 mg | Freq: Once | INTRAVENOUS | Status: AC
  Administered 2019-03-29: 2 mg via INTRAVENOUS
  Filled 2019-03-29: qty 1

## 2019-03-29 MED ORDER — LIDOCAINE 2% (20 MG/ML) 5 ML SYRINGE
INTRAMUSCULAR | Status: DC | PRN
  Administered 2019-03-29: 60 mg via INTRAVENOUS

## 2019-03-29 MED ORDER — ONDANSETRON HCL 4 MG/2ML IJ SOLN
4.0000 mg | Freq: Once | INTRAMUSCULAR | Status: AC
  Administered 2019-03-29: 4 mg via INTRAVENOUS
  Filled 2019-03-29: qty 2

## 2019-03-29 MED ORDER — LACTATED RINGERS IV SOLN
INTRAVENOUS | Status: DC | PRN
  Administered 2019-03-29 – 2019-03-30 (×2): via INTRAVENOUS

## 2019-03-29 MED ORDER — SCOPOLAMINE 1 MG/3DAYS TD PT72
MEDICATED_PATCH | TRANSDERMAL | Status: AC
  Filled 2019-03-29: qty 1

## 2019-03-29 MED ORDER — HYDROCODONE-ACETAMINOPHEN 5-325 MG PO TABS: 1.0000 | ORAL_TABLET | ORAL | Status: DC | PRN

## 2019-03-29 MED ORDER — SODIUM CHLORIDE 0.9 % IV SOLN
Freq: Once | INTRAVENOUS | Status: AC
  Administered 2019-03-29: 21:00:00 via INTRAVENOUS

## 2019-03-29 MED ORDER — PROPOFOL 10 MG/ML IV BOLUS
INTRAVENOUS | Status: DC | PRN
  Administered 2019-03-29: 200 mg via INTRAVENOUS

## 2019-03-29 MED ORDER — FENTANYL CITRATE (PF) 250 MCG/5ML IJ SOLN
INTRAMUSCULAR | Status: AC
  Filled 2019-03-29: qty 5

## 2019-03-29 MED ORDER — MIDAZOLAM HCL 5 MG/5ML IJ SOLN
INTRAMUSCULAR | Status: DC | PRN
  Administered 2019-03-29: 2 mg via INTRAVENOUS

## 2019-03-29 MED ORDER — METRONIDAZOLE IN NACL 5-0.79 MG/ML-% IV SOLN: 500.0000 mg | Freq: Three times a day (TID) | INTRAVENOUS | Status: DC

## 2019-03-29 MED ORDER — SODIUM CHLORIDE 0.9 % IR SOLN
Status: DC | PRN
  Administered 2019-03-29: 1000 mL

## 2019-03-29 MED ORDER — ONDANSETRON HCL 4 MG/2ML IJ SOLN: 4.0000 mg | Freq: Four times a day (QID) | INTRAMUSCULAR | Status: DC | PRN

## 2019-03-29 MED ORDER — SODIUM CHLORIDE (PF) 0.9 % IJ SOLN
INTRAMUSCULAR | Status: AC
  Filled 2019-03-29: qty 50

## 2019-03-29 MED ORDER — PHENYLEPHRINE 40 MCG/ML (10ML) SYRINGE FOR IV PUSH (FOR BLOOD PRESSURE SUPPORT)
PREFILLED_SYRINGE | INTRAVENOUS | Status: DC | PRN
  Administered 2019-03-29: 120 ug via INTRAVENOUS

## 2019-03-29 MED ORDER — ONDANSETRON 4 MG PO TBDP: 4.0000 mg | ORAL_TABLET | Freq: Four times a day (QID) | ORAL | Status: DC | PRN

## 2019-03-29 MED ORDER — SODIUM CHLORIDE 0.9 % IV SOLN: 2.0000 g | INTRAVENOUS | Status: DC

## 2019-03-29 MED ORDER — ROCURONIUM BROMIDE 10 MG/ML (PF) SYRINGE
PREFILLED_SYRINGE | INTRAVENOUS | Status: DC | PRN
  Administered 2019-03-29: 40 mg via INTRAVENOUS

## 2019-03-29 MED ORDER — SODIUM CHLORIDE 0.9 % IV SOLN
2.0000 g | Freq: Once | INTRAVENOUS | Status: AC
  Administered 2019-03-29: 2 g via INTRAVENOUS
  Filled 2019-03-29: qty 20

## 2019-03-29 MED ORDER — BUPIVACAINE-EPINEPHRINE 0.25% -1:200000 IJ SOLN
INTRAMUSCULAR | Status: DC | PRN
  Administered 2019-03-29: 20 mL

## 2019-03-29 MED ORDER — ONDANSETRON HCL 4 MG/2ML IJ SOLN
INTRAMUSCULAR | Status: AC
  Filled 2019-03-29: qty 2

## 2019-03-29 MED ORDER — PROPOFOL 10 MG/ML IV BOLUS
INTRAVENOUS | Status: AC
  Filled 2019-03-29: qty 20

## 2019-03-29 MED ORDER — SUCCINYLCHOLINE CHLORIDE 20 MG/ML IJ SOLN
INTRAMUSCULAR | Status: DC | PRN
  Administered 2019-03-29: 100 mg via INTRAVENOUS

## 2019-03-29 MED ORDER — MIDAZOLAM HCL 2 MG/2ML IJ SOLN
INTRAMUSCULAR | Status: AC
  Filled 2019-03-29: qty 2

## 2019-03-29 MED ORDER — IOHEXOL 300 MG/ML  SOLN
100.0000 mL | Freq: Once | INTRAMUSCULAR | Status: AC | PRN
  Administered 2019-03-29: 100 mL via INTRAVENOUS

## 2019-03-29 MED ORDER — ACETAMINOPHEN 325 MG PO TABS: 650.0000 mg | ORAL_TABLET | Freq: Four times a day (QID) | ORAL | Status: DC | PRN

## 2019-03-29 MED ORDER — MORPHINE SULFATE (PF) 4 MG/ML IV SOLN
4.0000 mg | Freq: Once | INTRAVENOUS | Status: AC
  Administered 2019-03-29: 4 mg via INTRAVENOUS
  Filled 2019-03-29: qty 1

## 2019-03-29 MED ORDER — FENTANYL CITRATE (PF) 100 MCG/2ML IJ SOLN
INTRAMUSCULAR | Status: DC | PRN
  Administered 2019-03-29 (×2): 100 ug via INTRAVENOUS
  Administered 2019-03-30: 50 ug via INTRAVENOUS

## 2019-03-29 MED ORDER — SCOPOLAMINE 1 MG/3DAYS TD PT72
MEDICATED_PATCH | TRANSDERMAL | Status: DC | PRN
  Administered 2019-03-29: 1 via TRANSDERMAL

## 2019-03-29 MED ORDER — METRONIDAZOLE IN NACL 5-0.79 MG/ML-% IV SOLN
500.0000 mg | Freq: Once | INTRAVENOUS | Status: AC
  Administered 2019-03-29: 500 mg via INTRAVENOUS
  Filled 2019-03-29: qty 100

## 2019-03-29 MED ORDER — ACETAMINOPHEN 650 MG RE SUPP: 650.0000 mg | Freq: Four times a day (QID) | RECTAL | Status: DC | PRN

## 2019-03-29 MED ORDER — BUPIVACAINE-EPINEPHRINE (PF) 0.25% -1:200000 IJ SOLN
INTRAMUSCULAR | Status: AC
  Filled 2019-03-29: qty 30

## 2019-03-29 SURGICAL SUPPLY — 34 items
APPLIER CLIP ROT 10 11.4 M/L (STAPLE) ×3
CHLORAPREP W/TINT 26 (MISCELLANEOUS) ×3 IMPLANT
CLIP APPLIE ROT 10 11.4 M/L (STAPLE) ×1 IMPLANT
CLOSURE WOUND 1/2 X4 (GAUZE/BANDAGES/DRESSINGS) ×1
COVER SURGICAL LIGHT HANDLE (MISCELLANEOUS) ×3 IMPLANT
COVER WAND RF STERILE (DRAPES) IMPLANT
CUTTER FLEX LINEAR 45M (STAPLE) ×3 IMPLANT
DECANTER SPIKE VIAL GLASS SM (MISCELLANEOUS) IMPLANT
DERMABOND ADVANCED (GAUZE/BANDAGES/DRESSINGS) ×2
DERMABOND ADVANCED .7 DNX12 (GAUZE/BANDAGES/DRESSINGS) ×1 IMPLANT
DRAPE LAPAROSCOPIC ABDOMINAL (DRAPES) ×3 IMPLANT
ELECT PENCIL ROCKER SW 15FT (MISCELLANEOUS) ×3 IMPLANT
ELECT REM PT RETURN 15FT ADLT (MISCELLANEOUS) ×3 IMPLANT
ENDOLOOP SUT PDS II  0 18 (SUTURE)
ENDOLOOP SUT PDS II 0 18 (SUTURE) IMPLANT
GLOVE SURG ORTHO 8.0 STRL STRW (GLOVE) ×3 IMPLANT
GOWN STRL REUS W/TWL XL LVL3 (GOWN DISPOSABLE) ×6 IMPLANT
KIT BASIN OR (CUSTOM PROCEDURE TRAY) ×3 IMPLANT
KIT TURNOVER KIT A (KITS) IMPLANT
POUCH SPECIMEN RETRIEVAL 10MM (ENDOMECHANICALS) ×3 IMPLANT
RELOAD 45 VASCULAR/THIN (ENDOMECHANICALS) IMPLANT
RELOAD STAPLE TA45 3.5 REG BLU (ENDOMECHANICALS) ×6 IMPLANT
SET IRRIG TUBING LAPAROSCOPIC (IRRIGATION / IRRIGATOR) ×3 IMPLANT
SET TUBE SMOKE EVAC HIGH FLOW (TUBING) ×3 IMPLANT
SHEARS HARMONIC ACE PLUS 36CM (ENDOMECHANICALS) ×3 IMPLANT
STRIP CLOSURE SKIN 1/2X4 (GAUZE/BANDAGES/DRESSINGS) ×2 IMPLANT
SUT MNCRL AB 4-0 PS2 18 (SUTURE) ×3 IMPLANT
TOWEL OR 17X26 10 PK STRL BLUE (TOWEL DISPOSABLE) ×3 IMPLANT
TOWEL OR NON WOVEN STRL DISP B (DISPOSABLE) ×3 IMPLANT
TRAY FOLEY MTR SLVR 14FR STAT (SET/KITS/TRAYS/PACK) ×3 IMPLANT
TRAY FOLEY MTR SLVR 16FR STAT (SET/KITS/TRAYS/PACK) IMPLANT
TRAY LAPAROSCOPIC (CUSTOM PROCEDURE TRAY) ×3 IMPLANT
TROCAR XCEL BLUNT TIP 100MML (ENDOMECHANICALS) ×3 IMPLANT
TROCAR XCEL NON-BLD 11X100MML (ENDOMECHANICALS) ×3 IMPLANT

## 2019-03-29 NOTE — ED Notes (Signed)
Phlebotomy called for Type and Screen.

## 2019-03-29 NOTE — ED Triage Notes (Signed)
Pt had constipation for couple days with abd pains that getting worse after taking laxatives and Mag Citrate.

## 2019-03-29 NOTE — Anesthesia Procedure Notes (Signed)
Procedure Name: Intubation Performed by: Gean Maidens, CRNA Pre-anesthesia Checklist: Patient identified, Emergency Drugs available, Suction available, Patient being monitored and Timeout performed Patient Re-evaluated:Patient Re-evaluated prior to induction Oxygen Delivery Method: Circle system utilized Preoxygenation: Pre-oxygenation with 100% oxygen Induction Type: IV induction and Rapid sequence Laryngoscope Size: Mac and 3 Grade View: Grade I Tube type: Oral Tube size: 7.5 mm Number of attempts: 1 Airway Equipment and Method: Stylet Placement Confirmation: ETT inserted through vocal cords under direct vision,  positive ETCO2 and breath sounds checked- equal and bilateral Secured at: 22 cm Tube secured with: Tape Dental Injury: Teeth and Oropharynx as per pre-operative assessment

## 2019-03-29 NOTE — ED Notes (Signed)
Patient wiped down with CHG wipes, patient's husband took patient's belongings (bookbad, clothes, phone and charger). COVID negative.

## 2019-03-29 NOTE — ED Provider Notes (Signed)
[facility]-EMERGENCY DEPT Provider Note   CSN: [id number] Arrival date & time: 03/29/19  1544    History   Chief Complaint Chief Complaint  Patient presents with  . Constipation  . Abdominal Pain    HPI Benna DunksKelly Goold is a 43 y.o. female.  He is present with chief complaint severe right lower quadrant abdominal pain.  Reports pain is been going on for the past couple days, certainly worsened over the past 12 hours ago.  Has up to 10 out of 10 pain, has not taken any pain medicine today.  States pain is been so severe that she has not been able to tolerate any solids or liquids today.  No vomiting.  Has been constipated lately.  Did have one bowel movement today, nonbloody.     HPI  Past Medical History:  Diagnosis Date  . Anal fissure 2008  . GERD (gastroesophageal reflux disease)   . Interstitial cystitis     Patient Active Problem List   Diagnosis Date Noted  . Acute appendicitis 03/29/2019  . Numbness 02/24/2019  . 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 Insomnia 02/24/2019  . Piriformis syndrome of right side 02/24/2019  . Ulnar neuropathy at elbow of right upper extremity 02/24/2019  . Obesity 01/02/2016    Past Surgical History:  Procedure Laterality Date  . CESAREAN SECTION    . CHOLECYSTECTOMY    . ENDOMETRIAL ABLATION     removal of scar tissue     OB History    Gravida  3   Para  1   Term  1   Preterm      AB  1   Living  1     SAB  1   TAB      Ectopic      Multiple      Live Births               Home Medications    Prior to Admission medications   Medication Sig Start Date End Date Taking? Authorizing Provider  amitriptyline (ELAVIL) 10 MG tablet Take 20 mg by mouth at bedtime.   Yes [provider]  cholecalciferol (VITAMIN D3) 25 MCG (1000 UT) tablet Take 1,000 Units by mouth every Saturday.    Yes [provider]  magnesium citrate SOLN Take 0.5 Bottles by mouth daily as needed  for severe constipation.   Yes [provider]  Multiple Vitamin (MULTIVITAMIN WITH MINERALS) TABS tablet Take 1 tablet by mouth daily.   Yes [provider]  phentermine (ADIPEX-P) 37.5 MG tablet Take 37.5 mg by mouth every morning.   Yes [provider]  pregabalin (LYRICA) 50 MG capsule Take 50 mg by mouth 3 (three) times daily as needed (pain).  11/08/18  Yes [provider]  senna (SENOKOT) 8.6 MG tablet Take 1 tablet by mouth daily as needed for constipation.   Yes [provider]    Family History Family History  Problem Relation Age of Onset  . Colon cancer Maternal Grandmother   . Colon polyps Maternal Grandmother   . Breast cancer Paternal Grandmother   . Esophageal cancer Neg Hx   . Rectal cancer Neg Hx   . Stomach cancer Neg Hx     Social History Social History   Tobacco Use  . Smoking status: Never Smoker  . Smokeless tobacco: Never Used  Substance Use Topics  . Alcohol use: Not Currently    Alcohol/week: 0.0 standard drinks  .  Drug use: No     Allergies   Patient has no known allergies.   Review of Systems Review of Systems  Constitutional: Negative for chills and fever.  HENT: Negative for ear pain and sore throat.   Eyes: Negative for pain and visual disturbance.  Respiratory: Negative for cough and shortness of breath.   Cardiovascular: Negative for chest pain and palpitations.  Gastrointestinal: Negative for abdominal pain and vomiting.  Genitourinary: Negative for dysuria and hematuria.  Musculoskeletal: Negative for arthralgias and back pain.  Skin: Negative for color change and rash.  Neurological: Negative for seizures and syncope.  All other systems reviewed and are negative.    Physical Exam Updated Vital Signs BP 127/77   Pulse 76   Temp 97.6 F (36.4 C) (Oral)   Resp 15   LMP 03/15/2019   SpO2 100%   Physical Exam Vitals signs and nursing note reviewed.  Constitutional:      General:  She is not in acute distress.    Appearance: She is well-developed.  HENT:     Head: Normocephalic and atraumatic.  Eyes:     Conjunctiva/sclera: Conjunctivae normal.  Neck:     Musculoskeletal: Neck supple.  Cardiovascular:     Rate and Rhythm: Normal rate and regular rhythm.     Heart sounds: No murmur.  Pulmonary:     Effort: Pulmonary effort is normal. No respiratory distress.     Breath sounds: Normal breath sounds.  Abdominal:     General: Bowel sounds are normal.     Palpations: Abdomen is soft.     Tenderness: There is abdominal tenderness in the right lower quadrant. There is no guarding or rebound.  Skin:    General: Skin is warm and dry.  Neurological:     Mental Status: She is alert.      ED Treatments / Results  Labs (all labs ordered are listed, but only abnormal results are displayed) Labs Reviewed  CBC WITH DIFFERENTIAL/PLATELET - Abnormal; Notable for the following components:      Result Value   WBC 19.7 (*)    Neutro Abs 17.2 (*)    Monocytes Absolute 1.4 (*)    Abs Immature Granulocytes 0.09 (*)    All other components within normal limits  COMPREHENSIVE METABOLIC PANEL - Abnormal; Notable for the following components:   Glucose, Bld 125 (*)    Total Protein 8.2 (*)    All other components within normal limits  URINALYSIS, ROUTINE W REFLEX MICROSCOPIC - Abnormal; Notable for the following components:   Ketones, ur 20 (*)    All other components within normal limits  SARS CORONAVIRUS 2 (HOSPITAL ORDER, [facility])  LIPASE, BLOOD  HIV ANTIBODY (ROUTINE TESTING W REFLEX)  POC URINE PREG, ED  TYPE AND SCREEN  ABO/RH    EKG None  Radiology Ct Abdomen Pelvis W Contrast  Result Date: 03/29/2019 CLINICAL DATA:  Abdominal pain with appendicitis suspected. EXAM: CT ABDOMEN AND PELVIS WITH CONTRAST TECHNIQUE: Multidetector CT imaging of the abdomen and pelvis was performed using the standard protocol following bolus  administration of intravenous contrast. CONTRAST:  176mL OMNIPAQUE IOHEXOL 300 MG/ML  SOLN COMPARISON:  None. FINDINGS: Lower chest: The lung bases are clear. The heart size is normal. Hepatobiliary: The liver is normal. Status post cholecystectomy.There is no biliary ductal dilation. Pancreas: Normal contours without ductal dilatation. No peripancreatic fluid collection. Spleen: No splenic laceration or hematoma. Adrenals/Urinary Tract: --Adrenal glands: No adrenal hemorrhage. --Right kidney/ureter:  There are few areas of increased density within the collecting system favored to represent excreted contrast as opposed to nonobstructing stones. --Left kidney/ureter: There are few areas of increased density within the collecting system favored to represent excreted contrast as opposed to nonobstructing stones. --Urinary bladder: Unremarkable. Stomach/Bowel: --Stomach/Duodenum: No hiatal hernia or other gastric abnormality. Normal duodenal course and caliber. --Small bowel: No dilatation or inflammation. --Colon: There is a large amount of stool throughout the colon. There is liquid stool within the transverse colon, ascending colon, and cecum. --Appendix: Appendix: Location: Right lower quadrant, retrocecal Diameter: 1.3 cm Appendicolith: Multiple appendicular lith are noted. The largest is located at the origin of the appendix and measures approximately 0.8 cm. Mucosal hyper-enhancement: Present Extraluminal gas: Not present Periappendiceal collection: There is free fluid in the right lower quadrant that is not yet formed into a drainable fluid collection or abscess. This is favored to represent reactive free fluid. Vascular/Lymphatic: Normal course and caliber of the major abdominal vessels. --No retroperitoneal lymphadenopathy. --No mesenteric lymphadenopathy. --No pelvic or inguinal lymphadenopathy. Reproductive: Unremarkable Other: There is a small amount of free fluid in the pelvis. The abdominal wall is normal.  Musculoskeletal. No acute displaced fractures. IMPRESSION: Findings consistent with acute uncomplicated appendicitis as detailed above. There is a small amount of free fluid in the pelvis which may be physiologic or reactive. Electronically Signed   By: Katherine Mantlehristopher  Green M.D.   On: 03/29/2019 20:46    Procedures Procedures (including critical care time)  Medications Ordered in ED Medications  sodium chloride (PF) 0.9 % injection (has no administration in time range)  cefTRIAXone (ROCEPHIN) 2 g in sodium chloride 0.9 % 100 mL IVPB ( Intravenous Automatically Held 04/06/19 2245)    And  metroNIDAZOLE (FLAGYL) IVPB 500 mg ( Intravenous Automatically Held 04/06/19 2245)  acetaminophen (TYLENOL) tablet 650 mg ( Oral MAR Hold 03/29/19 2317)    Or  acetaminophen (TYLENOL) suppository 650 mg ( Rectal MAR Hold 03/29/19 2317)  HYDROcodone-acetaminophen (NORCO/VICODIN) 5-325 MG per tablet 1-2 tablet ( Oral MAR Hold 03/29/19 2317)  HYDROmorphone (DILAUDID) injection 1 mg ( Intravenous MAR Hold 03/29/19 2317)  ondansetron (ZOFRAN-ODT) disintegrating tablet 4 mg ( Oral MAR Hold 03/29/19 2317)    Or  ondansetron (ZOFRAN) injection 4 mg ( Intravenous MAR Hold 03/29/19 2317)  scopolamine (TRANSDERM-SCOP) 1 MG/3DAYS (has no administration in time range)  morphine 4 MG/ML injection 4 mg (4 mg Intravenous Given 03/29/19 2006)  ondansetron (ZOFRAN) injection 4 mg (4 mg Intravenous Given 03/29/19 2006)  iohexol (OMNIPAQUE) 300 MG/ML solution 100 mL (100 mLs Intravenous Contrast Given 03/29/19 2027)  cefTRIAXone (ROCEPHIN) 2 g in sodium chloride 0.9 % 100 mL IVPB (0 g Intravenous Stopped 03/29/19 2154)  metroNIDAZOLE (FLAGYL) IVPB 500 mg (0 mg Intravenous Stopped 03/29/19 2219)  0.9 %  sodium chloride infusion ( Intravenous New Bag/Given 03/29/19 2114)  morphine 2 MG/ML injection 2 mg (2 mg Intravenous Given 03/29/19 2123)  ondansetron (ZOFRAN) 4 MG/2ML injection (has no administration in time range)  propofol (DIPRIVAN) 10  mg/mL bolus/IV push (has no administration in time range)  fentaNYL (SUBLIMAZE) 250 MCG/5ML injection (has no administration in time range)  midazolam (VERSED) 2 MG/2ML injection (has no administration in time range)  bupivacaine-epinephrine (MARCAINE W/ EPI) 0.25% -1:200000 injection (has no administration in time range)     Initial Impression / Assessment and Plan / ED Course  I have reviewed the triage vital signs and the nursing notes.  Pertinent labs & imaging results that were available during  my care of the patient were reviewed by me and considered in my medical decision making (see chart for details).  Clinical Course as of Mar 28 2334  Mon Mar 29, 2019  2032 Reviewed CT, likely appy   [RD]    Clinical Course User Index [RD] Milagros Lollykstra, Braylon Lemmons S, MD      43 year old lady presents to the ER with right lower quadrant domino pain.  Labs concern for leukocytosis, CT showed acute appendicitis.  Started Rocephin, Flagyl, consulted general surgery.  Patient taken to the OR for appendectomy. Hemodynamically stable while in the emergency department.  [facility] admitting.  Final Clinical Impressions(s) / ED Diagnoses   Final diagnoses:  Acute appendicitis, unspecified acute appendicitis type    ED Discharge Orders    None       Milagros Lollykstra, Emaan Gary S, MD 03/29/19 2336

## 2019-03-29 NOTE — H&P (Signed)
Casey DunksKelly Oneill is an 43 y.o. female.    General Surgery [facility]  Chief Complaint: abdominal pain, acute appendicitis  HPI: Patient is a 43 year old female who presented to the emergency department with 24-hour history of abdominal pain localizing to the right mid abdomen.  This is been associated with nausea but no emesis.  Patient has noted sweats and chills.  Pain is become more severe.  In the emergency department, white blood cell count was elevated at 19,000.  Subsequent CT scan of the abdomen and pelvis demonstrated findings consistent with acute appendicitis without complication or perforation.  Appendix does appear to be retrocecal in location.  Patient has undergone previous cholecystectomy, 2 cesarean sections, and an operation for endometriosis.  Past Medical History:  Diagnosis Date  . Anal fissure 2008  . GERD (gastroesophageal reflux disease)   . Interstitial cystitis     Past Surgical History:  Procedure Laterality Date  . CESAREAN SECTION    . CHOLECYSTECTOMY    . ENDOMETRIAL ABLATION     removal of scar tissue    Family History  Problem Relation Age of Onset  . Colon cancer Maternal Grandmother   . Colon polyps Maternal Grandmother   . Breast cancer Paternal Grandmother   . Esophageal cancer Neg Hx   . Rectal cancer Neg Hx   . Stomach cancer Neg Hx    Social History:  reports that she has never smoked. She has never used smokeless tobacco. She reports previous alcohol use. She reports that she does not use drugs.  Allergies: No Known Allergies  (Not in a hospital admission)   Results for orders placed or performed during the hospital encounter of 03/29/19 (from the past 48 hour(s))  CBC with Differential     Status: Abnormal   Collection Time: 03/29/19  7:09 PM  Result Value Ref Range   WBC 19.7 (H) 4.0 - 10.5 K/uL   RBC 4.58 3.87 - 5.11 MIL/uL   Hemoglobin 13.2 12.0 - 15.0 g/dL   HCT 16.140.8 09.636.0 - 04.546.0 %   MCV 89.1 80.0 - 100.0  fL   MCH 28.8 26.0 - 34.0 pg   MCHC 32.4 30.0 - 36.0 g/dL   RDW 40.912.9 81.111.5 - 91.415.5 %   Platelets PLATELET CLUMPS NOTED ON SMEAR, UNABLE TO ESTIMATE 150 - 400 K/uL    Comment: Immature Platelet Fraction may be clinically indicated, consider ordering this additional test [id number]    nRBC 0.0 0.0 - 0.2 %   Neutrophils Relative % 87 %   Neutro Abs 17.2 (H) 1.7 - 7.7 K/uL   Lymphocytes Relative 5 %   Lymphs Abs 1.0 0.7 - 4.0 K/uL   Monocytes Relative 7 %   Monocytes Absolute 1.4 (H) 0.1 - 1.0 K/uL   Eosinophils Relative 0 %   Eosinophils Absolute 0.0 0.0 - 0.5 K/uL   Basophils Relative 0 %   Basophils Absolute 0.1 0.0 - 0.1 K/uL   Immature Granulocytes 1 %   Abs Immature Granulocytes 0.09 (H) 0.00 - 0.07 K/uL    Comment: Performed at [facility], [address] [place], [place] [address]  Comprehensive metabolic panel     Status: Abnormal   Collection Time: 03/29/19  7:09 PM  Result Value Ref Range   Sodium 136 135 - 145 mmol/L   Potassium 3.6 3.5 - 5.1 mmol/L   Chloride 101 98 - 111 mmol/L   CO2 23 22 - 32 mmol/L   Glucose, Bld 125 (H)  70 - 99 mg/dL   BUN 10 6 - 20 mg/dL   Creatinine, Ser 0.65 0.44 - 1.00 mg/dL   Calcium 9.1 8.9 - 09.610.3 mg/dL   Total Protein 8.2 (H) 6.5 - 8.1 g/dL   Albumin 4.4 3.5 - 5.0 g/dL   AST 24 15 - 41 U/L   ALT 27 0 - 44 U/L   Alkaline Phosphatase 46 38 - 126 U/L   Total Bilirubin 1.1 0.3 - 1.2 mg/dL   GFR 1.61calc non Af Amer >60 >60 mL/min   GFR calc Af Amer >60 >60 mL/min   Anion gap 12 5 - 15    Comment: Performed at [facility], [address] [place], [place] [address]  Lipase, blood     Status: None   Collection Time: 03/29/19  7:09 PM  Result Value Ref Range   Lipase 29 11 - 51 U/L    Comment: Performed at [facility], [address] [place], [place] [address]  Urinalysis, Routine w reflex microscopic     Status: Abnormal   Collection Time: 03/29/19  8:03 PM  Result Value Ref Range    Color, Urine YELLOW YELLOW   APPearance CLEAR CLEAR   Specific Gravity, Urine 1.014 1.005 - 1.030   pH 8.0 5.0 - 8.0   Glucose, UA NEGATIVE NEGATIVE mg/dL   Hgb urine dipstick NEGATIVE NEGATIVE   Bilirubin Urine NEGATIVE NEGATIVE   Ketones, ur 20 (A) NEGATIVE mg/dL   Protein, ur NEGATIVE NEGATIVE mg/dL   Nitrite NEGATIVE NEGATIVE   Leukocytes,Ua NEGATIVE NEGATIVE    Comment: Performed at [facility], [address] [place], [place] [address]  POC Urine Pregnancy, ED (not at [facility])     Status: None   Collection Time: 03/29/19  8:11 PM  Result Value Ref Range   Preg Test, Ur NEGATIVE NEGATIVE    Comment:        THE SENSITIVITY OF THIS METHODOLOGY IS >24 mIU/mL    Ct Abdomen Pelvis W Contrast  Result Date: 03/29/2019 CLINICAL DATA:  Abdominal pain with appendicitis suspected. EXAM: CT ABDOMEN AND PELVIS WITH CONTRAST TECHNIQUE: Multidetector CT imaging of the abdomen and pelvis was performed using the standard protocol following bolus administration of intravenous contrast. CONTRAST:  100mL OMNIPAQUE IOHEXOL 300 MG/ML  SOLN COMPARISON:  None. FINDINGS: Lower chest: The lung bases are clear. The heart size is normal. Hepatobiliary: The liver is normal. Status post cholecystectomy.There is no biliary ductal dilation. Pancreas: Normal contours without ductal dilatation. No peripancreatic fluid collection. Spleen: No splenic laceration or hematoma. Adrenals/Urinary Tract: --Adrenal glands: No adrenal hemorrhage. --Right kidney/ureter: There are few areas of increased density within the collecting system favored to represent excreted contrast as opposed to nonobstructing stones. --Left kidney/ureter: There are few areas of increased density within the collecting system favored to represent excreted contrast as opposed to nonobstructing stones. --Urinary bladder: Unremarkable. Stomach/Bowel: --Stomach/Duodenum: No hiatal hernia or other gastric abnormality. Normal duodenal course  and caliber. --Small bowel: No dilatation or inflammation. --Colon: There is a large amount of stool throughout the colon. There is liquid stool within the transverse colon, ascending colon, and cecum. --Appendix: Appendix: Location: Right lower quadrant, retrocecal Diameter: 1.3 cm Appendicolith: Multiple appendicular lith are noted. The largest is located at the origin of the appendix and measures approximately 0.8 cm. Mucosal hyper-enhancement: Present Extraluminal gas: Not present Periappendiceal collection: There is free fluid in the right lower quadrant that is not yet formed into a drainable fluid  collection or abscess. This is favored to represent reactive free fluid. Vascular/Lymphatic: Normal course and caliber of the major abdominal vessels. --No retroperitoneal lymphadenopathy. --No mesenteric lymphadenopathy. --No pelvic or inguinal lymphadenopathy. Reproductive: Unremarkable Other: There is a small amount of free fluid in the pelvis. The abdominal wall is normal. Musculoskeletal. No acute displaced fractures. IMPRESSION: Findings consistent with acute uncomplicated appendicitis as detailed above. There is a small amount of free fluid in the pelvis which may be physiologic or reactive. Electronically Signed   By: Constance Holster M.D.   On: 03/29/2019 20:46    Review of Systems  Constitutional: Positive for chills and diaphoresis. Negative for fever.  HENT: Negative.   Eyes: Negative.   Respiratory: Negative.   Cardiovascular: Negative.   Gastrointestinal: Positive for abdominal pain and nausea. Negative for vomiting.  Genitourinary: Negative.   Musculoskeletal: Negative.   Skin: Negative.   Neurological: Negative.   Endo/Heme/Allergies: Negative.   Psychiatric/Behavioral: Negative.     Blood pressure 127/77, pulse 76, temperature 97.6 F (36.4 C), temperature source Oral, resp. rate 15, last menstrual period 03/15/2019, SpO2 100 %, unknown if currently breastfeeding. Physical Exam   Constitutional: She is oriented to person, place, and time. She appears well-developed and well-nourished. She appears distressed.  HENT:  Head: Normocephalic and atraumatic.  Right Ear: External ear normal.  Left Ear: External ear normal.  Eyes: Pupils are equal, round, and reactive to light. Conjunctivae are normal. No scleral icterus.  Neck: Normal range of motion. Neck supple. No tracheal deviation present. No thyromegaly present.  Cardiovascular: Normal rate, regular rhythm and normal heart sounds.  No murmur heard. Respiratory: Effort normal and breath sounds normal. No respiratory distress. She has no wheezes.  GI: Soft. She exhibits no distension and no mass. There is abdominal tenderness (right mid abdomen). There is guarding (voluntary). There is no rebound.  Musculoskeletal: Normal range of motion.        General: No tenderness, deformity or edema.  Neurological: She is alert and oriented to person, place, and time.  Skin: Skin is warm and dry. She is not diaphoretic.  Psychiatric: She has a normal mood and affect. Her behavior is normal.     Assessment/Plan Acute appendicitis  Admit to general surgery service  Rocephin and Flagyl started in ER and will continue until post-op  NPO, IV hydration  Laparoscopic appendectomy this admission  I discussed laparoscopic appendectomy with the patient.  I described the operation, the location of the surgical incisions, and the possibility of conversion to open surgery due to the location of the appendix.  Patient understands and agrees to proceed with surgery as soon as possible.  Patient has a COVID 19 nasal swab test pending.  We will await the results of that study before proceeding with surgery.  Depending on the timing of the test result, we will either proceed with laparoscopic appendectomy tonight or tomorrow morning.  The risks and benefits of the procedure have been discussed at length with the patient.  The patient  understands the proposed procedure, potential alternative treatments, and the course of recovery to be expected.  All of the patient's questions have been answered at this time.  The patient wishes to proceed with surgery.  Armandina Gemma, [place] Office: [phone]    Armandina Gemma, MD 03/29/2019, 10:21 PM

## 2019-03-29 NOTE — ED Triage Notes (Signed)
Per EMS pt complaint of generalized abdominal pain for 12 hours; also constipation for 4 days.

## 2019-03-29 NOTE — Anesthesia Preprocedure Evaluation (Addendum)
Anesthesia Evaluation  Patient identified by MRN, date of birth, ID band Patient awake    Reviewed: Allergy & Precautions, NPO status , Patient's Chart, lab work & pertinent test results  Airway Mallampati: II  TM Distance: >3 FB Neck ROM: Full    Dental no notable dental hx. (+) Teeth Intact   Pulmonary neg pulmonary ROS,    Pulmonary exam normal breath sounds clear to auscultation       Cardiovascular negative cardio ROS   Rhythm:Regular Rate:Tachycardia     Neuro/Psych Ulnar neuropathy Pyriformis syndrome  Neuromuscular disease negative psych ROS   GI/Hepatic Neg liver ROS, GERD  Medicated and Controlled,Acute appendicitis   Endo/Other  Obesity  Renal/GU negative Renal ROS   Hx/o interstitial cystitis     Musculoskeletal negative musculoskeletal ROS (+)   Abdominal (+) + obese,   Peds  Hematology negative hematology ROS (+)   Anesthesia Other Findings   Reproductive/Obstetrics                            Anesthesia Physical Anesthesia Plan  ASA: II and emergent  Anesthesia Plan: General   Post-op Pain Management:    Induction: Intravenous, Cricoid pressure planned and Rapid sequence  PONV Risk Score and Plan: 4 or greater and Scopolamine patch - Pre-op, Midazolam, Ondansetron, Dexamethasone and Treatment may vary due to age or medical condition  Airway Management Planned: Oral ETT  Additional Equipment:   Intra-op Plan:   Post-operative Plan: Extubation in OR  Informed Consent: I have reviewed the patients History and Physical, chart, labs and discussed the procedure including the risks, benefits and alternatives for the proposed anesthesia with the patient or authorized representative who has indicated his/her understanding and acceptance.     Dental advisory given  Plan Discussed with: CRNA and Surgeon  Anesthesia Plan Comments:         Anesthesia Quick  Evaluation

## 2019-03-30 ENCOUNTER — Encounter (HOSPITAL_COMMUNITY): Payer: Self-pay | Admitting: Surgery

## 2019-03-30 LAB — ABO/RH: ABO/RH(D): A POS

## 2019-03-30 MED ORDER — SUGAMMADEX SODIUM 200 MG/2ML IV SOLN
INTRAVENOUS | Status: DC | PRN
  Administered 2019-03-30: 200 mg via INTRAVENOUS

## 2019-03-30 MED ORDER — HYDROMORPHONE HCL 1 MG/ML IJ SOLN: 0.2500 mg | INTRAMUSCULAR | Status: DC | PRN

## 2019-03-30 MED ORDER — ACETAMINOPHEN 325 MG PO TABS: 650.0000 mg | ORAL_TABLET | ORAL | Status: AC

## 2019-03-30 MED ORDER — ACETAMINOPHEN 650 MG RE SUPP: 650.0000 mg | Freq: Four times a day (QID) | RECTAL | Status: DC | PRN

## 2019-03-30 MED ORDER — HYDROMORPHONE HCL 1 MG/ML IJ SOLN: 1.0000 mg | INTRAMUSCULAR | Status: DC | PRN

## 2019-03-30 MED ORDER — ACETAMINOPHEN 325 MG PO TABS: 650.0000 mg | ORAL_TABLET | Freq: Four times a day (QID) | ORAL | Status: DC | PRN

## 2019-03-30 MED ORDER — SODIUM CHLORIDE 0.9 % IV SOLN
2.0000 g | INTRAVENOUS | Status: DC
  Filled 2019-03-30: qty 20

## 2019-03-30 MED ORDER — HYDROCODONE-ACETAMINOPHEN 5-325 MG PO TABS
1.0000 | ORAL_TABLET | ORAL | Status: DC | PRN
  Administered 2019-03-30 (×2): 2 via ORAL
  Filled 2019-03-30 (×2): qty 2

## 2019-03-30 MED ORDER — AMITRIPTYLINE HCL 10 MG PO TABS: 20.0000 mg | ORAL_TABLET | Freq: Every day | ORAL | Status: DC

## 2019-03-30 MED ORDER — ONDANSETRON HCL 4 MG/2ML IJ SOLN
INTRAMUSCULAR | Status: DC | PRN
  Administered 2019-03-30: 4 mg via INTRAVENOUS

## 2019-03-30 MED ORDER — ONDANSETRON 4 MG PO TBDP: 4.0000 mg | ORAL_TABLET | Freq: Four times a day (QID) | ORAL | Status: DC | PRN

## 2019-03-30 MED ORDER — METRONIDAZOLE IN NACL 5-0.79 MG/ML-% IV SOLN
500.0000 mg | Freq: Three times a day (TID) | INTRAVENOUS | Status: DC
  Administered 2019-03-30: 500 mg via INTRAVENOUS
  Filled 2019-03-30: qty 100

## 2019-03-30 MED ORDER — ONDANSETRON HCL 4 MG/2ML IJ SOLN: 4.0000 mg | Freq: Four times a day (QID) | INTRAMUSCULAR | Status: DC | PRN

## 2019-03-30 MED ORDER — METOCLOPRAMIDE HCL 5 MG/ML IJ SOLN: 10.0000 mg | Freq: Once | INTRAMUSCULAR | Status: DC | PRN

## 2019-03-30 MED ORDER — PREGABALIN 50 MG PO CAPS: 50.0000 mg | ORAL_CAPSULE | Freq: Three times a day (TID) | ORAL | Status: DC | PRN

## 2019-03-30 MED ORDER — AMOXICILLIN-POT CLAVULANATE 875-125 MG PO TABS: 1.0000 | ORAL_TABLET | Freq: Two times a day (BID) | ORAL | 0 refills | Status: AC

## 2019-03-30 MED ORDER — MEPERIDINE HCL 50 MG/ML IJ SOLN: 6.2500 mg | INTRAMUSCULAR | Status: DC | PRN

## 2019-03-30 MED ORDER — DEXAMETHASONE SODIUM PHOSPHATE 10 MG/ML IJ SOLN
INTRAMUSCULAR | Status: DC | PRN
  Administered 2019-03-30: 5 mg via INTRAVENOUS

## 2019-03-30 MED ORDER — KCL IN DEXTROSE-NACL 20-5-0.45 MEQ/L-%-% IV SOLN
INTRAVENOUS | Status: DC
  Administered 2019-03-30: 02:00:00 via INTRAVENOUS
  Filled 2019-03-30: qty 1000

## 2019-03-30 MED ORDER — TRAMADOL HCL 50 MG PO TABS: 50.0000 mg | ORAL_TABLET | Freq: Four times a day (QID) | ORAL | Status: DC | PRN

## 2019-03-30 MED ORDER — TRAMADOL HCL 50 MG PO TABS: 50.0000 mg | ORAL_TABLET | Freq: Four times a day (QID) | ORAL | 0 refills | Status: AC | PRN

## 2019-03-30 NOTE — TOC Progression Note (Signed)
Transition of Care [facility]) - Progression Note    Patient Details  Name: Joury Allcorn MRN: [id number] Date of Birth: May 19, 1976  Transition of Care [facility]) CM/SW [place], [place] Phone Number: 03/30/2019, 10:24 AM  Clinical Narrative:    Patient chose [place] to accept insurance [facility] in network with insurance-patient agreeable    Expected Discharge Plan: [place] Barriers to Discharge: No Barriers Identified  Expected Discharge Plan and Services Expected Discharge Plan: [place]         Expected Discharge Date: 03/30/19               DME Arranged: (Has  RW and 3 IN1)         HH Arranged: PT HH [facility]: [facility] [employer] Date [place]: 03/30/19 Time [place]: [place] Representative spoke with at [place]: [place] ([place]) Interventions    Readmission Risk Interventions No flowsheet data found.

## 2019-03-30 NOTE — Anesthesia Postprocedure Evaluation (Signed)
Anesthesia Post Note  Patient: Casey Oneill  Procedure(s) Performed: APPENDECTOMY LAPAROSCOPIC (N/A Abdomen)     Patient location during evaluation: PACU Anesthesia Type: General Level of consciousness: awake and alert and oriented Pain management: pain level controlled Vital Signs Assessment: post-procedure vital signs reviewed and stable Respiratory status: spontaneous breathing, nonlabored ventilation, respiratory function stable and patient connected to nasal cannula oxygen Cardiovascular status: blood pressure returned to baseline and stable Postop Assessment: no apparent nausea or vomiting Anesthetic complications: no                  Gini Caputo A.

## 2019-03-30 NOTE — Discharge Instructions (Signed)
[facility]  Please arrive at least 30 min before your appointment to complete your check in paperwork.  If you are unable to arrive 30 min prior to your appointment time we may have to cancel or reschedule you. LAPAROSCOPIC SURGERY: POST OP INSTRUCTIONS Always review your discharge instruction sheet given to you by the facility where your surgery was performed. IF YOU HAVE DISABILITY OR FAMILY LEAVE FORMS, YOU MUST BRING THEM TO THE OFFICE FOR PROCESSING.   DO NOT GIVE THEM TO YOUR DOCTOR.  PAIN CONTROL  1. First take acetaminophen (Tylenol) AND/or ibuprofen (Advil) to control your pain after surgery.  Follow directions on package.  Taking acetaminophen (Tylenol) and/or ibuprofen (Advil) regularly after surgery will help to control your pain and lower the amount of prescription pain medication you may need.  You should not take more than 4,000 mg (4 grams) of acetaminophen (Tylenol) in 24 hours.  You should not take ibuprofen (Advil), aleve, motrin, naprosyn or other NSAIDS if you have a history of stomach ulcers or chronic kidney disease.  2. A prescription for pain medication may be given to you upon discharge.  Take your pain medication as prescribed, if you still have uncontrolled pain after taking acetaminophen (Tylenol) or ibuprofen (Advil). 3. Use ice packs to help control pain. 4. If you need a refill on your pain medication, please contact your pharmacy.  They will contact our office to request authorization. Prescriptions will not be filled after 5pm or on week-ends.  HOME MEDICATIONS 5. Take your usually prescribed medications unless otherwise directed.  DIET 6. You should follow a light diet the first few days after arrival home.  Be sure to include lots of fluids daily. Avoid fatty, fried foods.   CONSTIPATION 7. It is common to experience some constipation after surgery and if you are taking pain medication.  Increasing fluid intake and taking a stool  softener (such as Colace) will usually help or prevent this problem from occurring.  A mild laxative (Milk of Magnesia or Miralax) should be taken according to package instructions if there are no bowel movements after 48 hours.  WOUND/INCISION CARE 8. Most patients will experience some swelling and bruising in the area of the incisions.  Ice packs will help.  Swelling and bruising can take several days to resolve.  9. Unless discharge instructions indicate otherwise, follow guidelines below  a. STERI-STRIPS - you may remove your outer bandages 48 hours after surgery, and you may shower at that time.  You have steri-strips (small skin tapes) in place directly over the incision.  These strips should be left on the skin for 7-10 days.   b. DERMABOND/SKIN GLUE - you may shower in 24 hours.  The glue will flake off over the next 2-3 weeks. 10. Any sutures or staples will be removed at the office during your follow-up visit.  ACTIVITIES 11. You may resume regular (light) daily activities beginning the next day--such as daily self-care, walking, climbing stairs--gradually increasing activities as tolerated.  You may have sexual intercourse when it is comfortable.  Refrain from any heavy lifting or straining until approved by your doctor. a. You may drive when you are no longer taking prescription pain medication, you can comfortably wear a seatbelt, and you can safely maneuver your car and apply brakes.  FOLLOW-UP 12. You should see your doctor in the office for a follow-up appointment approximately 2-3 weeks after your surgery.  You should have been given your post-op/follow-up appointment when   your surgery was scheduled.  If you did not receive a post-op/follow-up appointment, make sure that you call for this appointment within a day or two after you arrive home to insure a convenient appointment time.   WHEN TO CALL YOUR DOCTOR: 1. Fever over 101.0 2. Inability to urinate 3. Continued bleeding from  incision. 4. Increased pain, redness, or drainage from the incision. 5. Increasing abdominal pain  The clinic staff is available to answer your questions during regular business hours.  Please don't hesitate to call and ask to speak to one of the nurses for clinical concerns.  If you have a medical emergency, go to the nearest emergency room or call 911.  A surgeon from [facility] is always on call at the hospital. [address], [address], [place], [place]  [address] ? [address] [address], [place], [place]   [address] [phone] ? [phone] ? FAX [phone]  .........   Managing Your Pain After Surgery Without Opioids    Thank you for participating in our program to help patients manage their pain after surgery without opioids. This is part of our effort to provide you with the best care possible, without exposing you or your family to the risk that opioids pose.  What pain can I expect after surgery? You can expect to have some pain after surgery. This is normal. The pain is typically worse the day after surgery, and quickly begins to get better. Many studies have found that many patients are able to manage their pain after surgery with Over-the-Counter (OTC) medications such as Tylenol and Motrin. If you have a condition that does not allow you to take Tylenol or Motrin, notify your surgical team.  How will I manage my pain? The best strategy for controlling your pain after surgery is around the clock pain control with Tylenol (acetaminophen) and Motrin (ibuprofen or Advil). Alternating these medications with each other allows you to maximize your pain control. In addition to Tylenol and Motrin, you can use heating pads or ice packs on your incisions to help reduce your pain.  How will I alternate your regular strength over-the-counter pain medication? You will take a dose of pain medication every three hours. ; Start by taking 650 mg of Tylenol (2 pills of 325  mg) ; 3 hours later take 600 mg of Motrin (3 pills of 200 mg) ; 3 hours after taking the Motrin take 650 mg of Tylenol ; 3 hours after that take 600 mg of Motrin.   - 1 -  See example - if your first dose of Tylenol is at 12:00 PM   12:00 PM Tylenol 650 mg (2 pills of 325 mg)  3:00 PM Motrin 600 mg (3 pills of 200 mg)  6:00 PM Tylenol 650 mg (2 pills of 325 mg)  9:00 PM Motrin 600 mg (3 pills of 200 mg)  Continue alternating every 3 hours   We recommend that you follow this schedule around-the-clock for at least 3 days after surgery, or until you feel that it is no longer needed. Use the table on the last page of this handout to keep track of the medications you are taking. Important: Do not take more than 3000mg of Tylenol or 3200mg of Motrin in a 24-hour period. Do not take ibuprofen/Motrin if you have a history of bleeding stomach ulcers, severe kidney disease, &/or actively taking a blood thinner  What if I still have pain? If you have pain that is not   controlled with the over-the-counter pain medications (Tylenol and Motrin or Advil) you might have what we call "breakthrough" pain. You will receive a prescription for a small amount of an opioid pain medication such as Oxycodone, Tramadol, or Tylenol with Codeine. Use these opioid pills in the first 24 hours after surgery if you have breakthrough pain. Do not take more than 1 pill every 4-6 hours.  If you still have uncontrolled pain after using all opioid pills, don't hesitate to call our staff using the number provided. We will help make sure you are managing your pain in the best way possible, and if necessary, we can provide a prescription for additional pain medication.   Day 1    Time  Name of Medication Number of pills taken  Amount of Acetaminophen  Pain Level   Comments  AM PM       AM PM       AM PM       AM PM       AM PM       AM PM       AM PM       AM PM       Total Daily amount of Acetaminophen Do not  take more than  3,000 mg per day      Day 2    Time  Name of Medication Number of pills taken  Amount of Acetaminophen  Pain Level   Comments  AM PM       AM PM       AM PM       AM PM       AM PM       AM PM       AM PM       AM PM       Total Daily amount of Acetaminophen Do not take more than  3,000 mg per day      Day 3    Time  Name of Medication Number of pills taken  Amount of Acetaminophen  Pain Level   Comments  AM PM       AM PM       AM PM       AM PM          AM PM       AM PM       AM PM       AM PM       Total Daily amount of Acetaminophen Do not take more than  3,000 mg per day      Day 4    Time  Name of Medication Number of pills taken  Amount of Acetaminophen  Pain Level   Comments  AM PM       AM PM       AM PM       AM PM       AM PM       AM PM       AM PM       AM PM       Total Daily amount of Acetaminophen Do not take more than  3,000 mg per day      Day 5    Time  Name of Medication Number of pills taken  Amount of Acetaminophen  Pain Level   Comments  AM PM       AM PM       AM   PM       AM PM       AM PM       AM PM       AM PM       AM PM       Total Daily amount of Acetaminophen Do not take more than  3,000 mg per day       Day 6    Time  Name of Medication Number of pills taken  Amount of Acetaminophen  Pain Level  Comments  AM PM       AM PM       AM PM       AM PM       AM PM       AM PM       AM PM       AM PM       Total Daily amount of Acetaminophen Do not take more than  3,000 mg per day      Day 7    Time  Name of Medication Number of pills taken  Amount of Acetaminophen  Pain Level   Comments  AM PM       AM PM       AM PM       AM PM       AM PM       AM PM       AM PM       AM PM       Total Daily amount of Acetaminophen Do not take more than  3,000 mg per day        For additional information about how and where to safely dispose of unused  opioid medications - https://www.morepowerfulnc.org  Disclaimer: This document contains information and/or instructional materials adapted from [facility] for the typical patient with your condition. It does not replace medical advice from your health care provider because your experience may differ from that of the typical patient. Talk to your health care provider if you have any questions about this document, your condition or your treatment plan. Adapted from [facility]   

## 2019-03-30 NOTE — Discharge Summary (Signed)
     Patient ID: Casey Oneill [id number] September 11, 1975 43 y.o.  Admit date: 03/29/2019 Discharge date: 03/30/2019  Admitting Diagnosis: Acute appendicitis  Discharge Diagnosis Patient Active Problem List   Diagnosis Date Noted  . Acute appendicitis 03/29/2019  . Numbness 02/24/2019  . 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 Insomnia 02/24/2019  . Piriformis syndrome of right side 02/24/2019  . Ulnar neuropathy at elbow of right upper extremity 02/24/2019  . Obesity 01/02/2016  s/p lap appy  Consultants none  Reason for Admission: Patient is a 43 year old female who presented to the emergency department with 24-hour history of abdominal pain localizing to the right mid abdomen.  This is been associated with nausea but no emesis.  Patient has noted sweats and chills.  Pain is become more severe.  In the emergency department, white blood cell count was elevated at 19,000.  Subsequent CT scan of the abdomen and pelvis demonstrated findings consistent with acute appendicitis without complication or perforation.  Appendix does appear to be retrocecal in location.  Patient has undergone previous cholecystectomy, 2 cesarean sections, and an operation for endometriosis.  Procedures Lap appy, Dr. Harlow Asa 8/11  Hospital Course:  The patient was admitted and underwent a laparoscopic appendectomy.  The patient tolerated the procedure well.  On POD 1, the patient was tolerating a diet, voiding well, mobilizing, and pain was controlled with oral pain medications.  The patient was stable for DC home at this time with appropriate follow up made.   Physical Exam: Abd: soft, incisions c/d/i, appropriately tender, ND  Allergies as of 03/30/2019   No Known Allergies     Medication List    TAKE these medications   acetaminophen 325 MG tablet Commonly known as: TYLENOL Take 2 tablets (650 mg total) by mouth every 4 (four) hours.   amitriptyline 10 MG tablet Commonly known as: ELAVIL Take  20 mg by mouth at bedtime.   amoxicillin-clavulanate 875-125 MG tablet Commonly known as: Augmentin Take 1 tablet by mouth 2 (two) times daily for 5 days.   cholecalciferol 25 MCG (1000 UT) tablet Commonly known as: VITAMIN D3 Take 1,000 Units by mouth every Saturday.   magnesium citrate Soln Take 0.5 Bottles by mouth daily as needed for severe constipation.   multivitamin with minerals Tabs tablet Take 1 tablet by mouth daily.   phentermine 37.5 MG tablet Commonly known as: ADIPEX-P Take 37.5 mg by mouth every morning.   pregabalin 50 MG capsule Commonly known as: LYRICA Take 50 mg by mouth 3 (three) times daily as needed (pain).   senna 8.6 MG tablet Commonly known as: SENOKOT Take 1 tablet by mouth daily as needed for constipation.   traMADol 50 MG tablet Commonly known as: ULTRAM Take 1 tablet (50 mg total) by mouth every 6 (six) hours as needed (mild pain).        Follow-up Information    [facility] up in 3 week(s).   Specialty: General Surgery Contact information: [place] [address] [phone]           Signed: Saverio Danker, [facility] 03/30/2019, 8:40 AM Pager: [phone]

## 2019-03-30 NOTE — Transfer of Care (Signed)
Immediate Anesthesia Transfer of Care Note  Patient: Casey Oneill  Procedure(s) Performed: APPENDECTOMY LAPAROSCOPIC (N/A Abdomen)  Patient Location: PACU  Anesthesia Type:General  Level of Consciousness: sedated, patient cooperative and responds to stimulation  Airway & Oxygen Therapy: Patient Spontanous Breathing and Patient connected to face mask oxygen  Post-op Assessment: Report given to RN and Post -op Vital signs reviewed and stable  Post vital signs: Reviewed and stable  Last Vitals:  Vitals Value Taken Time  BP    Temp    Pulse    Resp    SpO2      Last Pain:  Vitals:   03/29/19 2154  TempSrc:   PainSc: 9          Complications: No apparent anesthesia complications

## 2019-03-30 NOTE — Op Note (Signed)
OPERATIVE REPORT - LAPAROSCOPIC APPENDECTOMY  Preop diagnosis:  Acute appendicitis  Postop diagnosis:  same  Procedure:  Laparoscopic appendectomy  Surgeon:  Armandina Gemma, MD  Anesthesia:  general endotracheal  Estimated blood loss:  minimal  Preparation:  Chlora-prep  Complications:  none  Indications:  Patient is a 43 year old female who presented to the emergency department with 24-hour history of abdominal pain localizing to the right mid abdomen.  This is been associated with nausea but no emesis.  Patient has noted sweats and chills.  Pain is become more severe.  In the emergency department, white blood cell count was elevated at 19,000.  Subsequent CT scan of the abdomen and pelvis demonstrated findings consistent with acute appendicitis without complication or perforation.  Appendix does appear to be retrocecal in location.  Patient now comes to OR for lap appendectomy.  Procedure:  Patient is brought to the operating room and placed in a supine position on the operating room table. Following administration of general anesthesia, a time out was held and the patient's name and procedure is confirmed. Patient is then prepped and draped in the usual strict aseptic fashion.  After ascertaining that an adequate level of anesthesia has been achieved, a peri-umbilical incision is made with a #15 blade. Dissection is carried down to the fascia. Fascia is incised in the midline and the peritoneal cavity is entered cautiously. A #0-vicryl pursestring suture is placed in the fascia. An Hassan cannula is introduced under direct vision and secured with the pursestring suture. The abdomen is insufflated with carbon dioxide. The laparoscope is introduced and the abdomen is explored. Operative ports are placed in the right upper quadrant and left lower quadrant. The appendix is identified. There is extensive inflammation and thickening of the mesoappendix.  The mesoappendix is divided with the harmonic  scalpel. Dissection is carried down to the base of the appendix. The base of the appendix is dissected out clearing the junction with the cecal wall. Using an Endo-GIA stapler, the base of the appendix is transected at the junction with the cecal wall. There is good approximation of tissue along the staple line. There is good hemostasis along the staple line. The appendix is placed into an endo-catch bag and withdrawn through the umbilical port. The #0-vicryl pursestring suture is tied securely.  Right lower quadrant is irrigated with warm saline which is evacuated. Good hemostasis is noted. Ports are removed under direct vision. Good hemostasis is noted at the port sites. Pneumoperitoneum is released.  Skin incisions are anesthetized with local anesthetic. Wounds are closed with interrupted 4-0 Monocryl subcuticular sutures. Wounds are washed and dried and Dermabond was applied. The patient is awakened from anesthesia and brought to the recovery room. The patient tolerated the procedure well.  Armandina Gemma, MD [facility] Office: [phone]

## 2019-03-30 NOTE — Plan of Care (Signed)
Plan of care for day of surgery discussed with patient. All questions answered.   

## 2019-12-24 ENCOUNTER — Emergency Department (HOSPITAL_COMMUNITY)
Admission: EM | Admit: 2019-12-24 | Discharge: 2019-12-24 | Disposition: A | Discharge status: Home / Self Care | Payer: Federal, State, Local not specified - PPO | Attending: Emergency Medicine | Admitting: Emergency Medicine

## 2019-12-24 ENCOUNTER — Encounter (HOSPITAL_COMMUNITY): Payer: Self-pay | Admitting: *Deleted

## 2019-12-24 ENCOUNTER — Emergency Department (HOSPITAL_COMMUNITY): Payer: Federal, State, Local not specified - PPO

## 2019-12-24 ENCOUNTER — Other Ambulatory Visit: Payer: Self-pay

## 2019-12-24 DIAGNOSIS — Z79899 Other long term (current) drug therapy: Secondary | ICD-10-CM | POA: Diagnosis not present

## 2019-12-24 DIAGNOSIS — S161XXA Strain of muscle, fascia and tendon at neck level, initial encounter: Secondary | ICD-10-CM | POA: Diagnosis not present

## 2019-12-24 DIAGNOSIS — Y939 Activity, unspecified: Secondary | ICD-10-CM | POA: Insufficient documentation

## 2019-12-24 DIAGNOSIS — Y999 Unspecified external cause status: Secondary | ICD-10-CM | POA: Diagnosis not present

## 2019-12-24 DIAGNOSIS — R42 Dizziness and giddiness: Secondary | ICD-10-CM | POA: Diagnosis not present

## 2019-12-24 DIAGNOSIS — Z9049 Acquired absence of other specified parts of digestive tract: Secondary | ICD-10-CM | POA: Insufficient documentation

## 2019-12-24 DIAGNOSIS — S199XXA Unspecified injury of neck, initial encounter: Secondary | ICD-10-CM | POA: Diagnosis present

## 2019-12-24 DIAGNOSIS — Y92410 Unspecified street and highway as the place of occurrence of the external cause: Secondary | ICD-10-CM | POA: Diagnosis not present

## 2019-12-24 DIAGNOSIS — R079 Chest pain, unspecified: Secondary | ICD-10-CM | POA: Diagnosis not present

## 2019-12-24 DIAGNOSIS — T148XXA Other injury of unspecified body region, initial encounter: Secondary | ICD-10-CM

## 2019-12-24 MED ORDER — OXYCODONE-ACETAMINOPHEN 5-325 MG PO TABS
1.0000 | ORAL_TABLET | Freq: Once | ORAL | Status: AC
  Administered 2019-12-24: 1 via ORAL
  Filled 2019-12-24: qty 1

## 2019-12-24 MED ORDER — IBUPROFEN 600 MG PO TABS: 600.0000 mg | ORAL_TABLET | Freq: Four times a day (QID) | ORAL | 0 refills | Status: AC | PRN

## 2019-12-24 MED ORDER — CYCLOBENZAPRINE HCL 10 MG PO TABS: 10.0000 mg | ORAL_TABLET | Freq: Two times a day (BID) | ORAL | 0 refills | Status: AC | PRN
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
## 2019-12-24 NOTE — Discharge Instructions (Signed)
The CT scan of your brain and your cervical (upper) spine were normal.  There were no signs of fracture or brain bleed.  You will likely have muscle soreness and back/neck soreness for the next week.  It will likely feel WORSE tomorrow and the next day before it feels better.  Please take motrin 600 mg every 6 hours, as well as flexeril as needed, and use heat packs or ice on your back and muscles.  Please follow up with your primary care provider in 1 week for re-evaluation in the office or clinic.
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
## 2021-05-25 IMAGING — MR MRI HEAD WITHOUT AND WITH CONTRAST
14 of 27 series · 21 of 48 positions shown · IV contrast (Yes)
Comparison: Head CT 02/17/2019

CLINICAL DATA: Right-sided arm and leg tingling as well as right
facial numbness. Difficulty concentrating. Dysphagia. Symptoms
beginning after a medication change 1 month ago.

EXAM:
MRI HEAD WITHOUT AND WITH CONTRAST
MRA HEAD WITHOUT CONTRAST
MRA NECK WITHOUT AND WITH CONTRAST
MRI CERVICAL SPINE WITHOUT AND WITH CONTRAST
TECHNIQUE: Multiplanar, multiecho pulse sequences of the brain and surrounding
structures, and cervical spine, to include the craniocervical
junction and cervicothoracic junction, were obtained without and
with intravenous contrast.

[Series 4: DWI · axial · 3.0mm · 1.09mm/px · z∈[-80,+60]mm · 2 of 96 slices shown (1 of 4)]
[im 1/96]
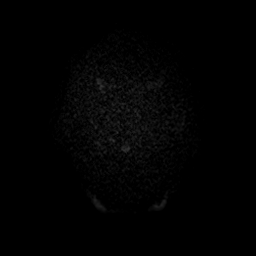
[im 96/96]
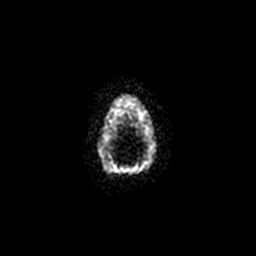

[Series 5: T1 · sagittal · 5.0mm · 0.47mm/px · 1 of 22 slices shown (1 of 2)]
[im 1/22]
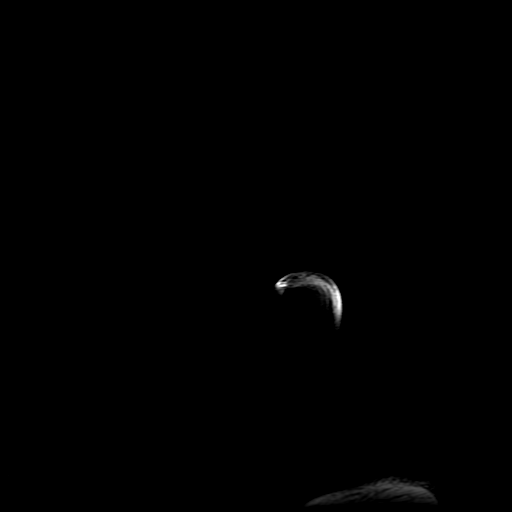

[Series 6: T2 · axial · 5.0mm · 0.43mm/px · 1 of 25 slices shown (1 of 4)]
[im 1/25]
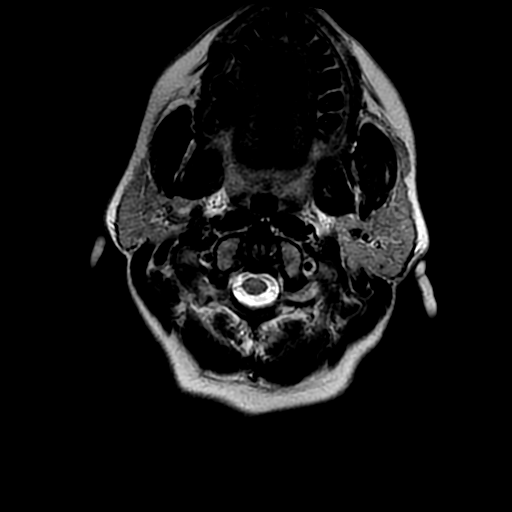

[Series 7: FLAIR · axial · 3.0mm · 0.43mm/px · 1 of 25 slices shown]
[im 1/25]
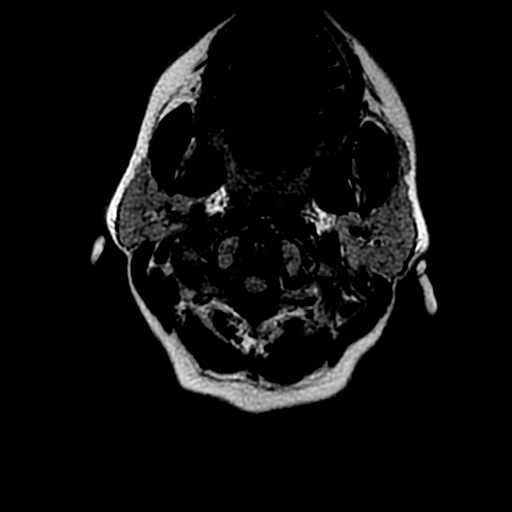

[Series 10: T1 · axial · 1.0mm · 0.47mm/px · z∈[-80,+67]mm · 3 of 150 slices shown (2 of 2)]
[im 1/150]
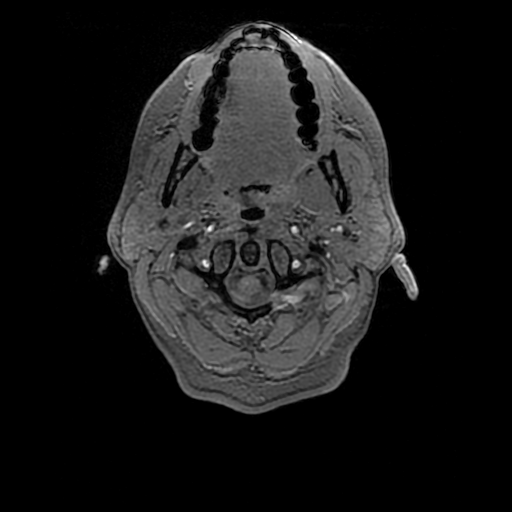
[im 75/150]
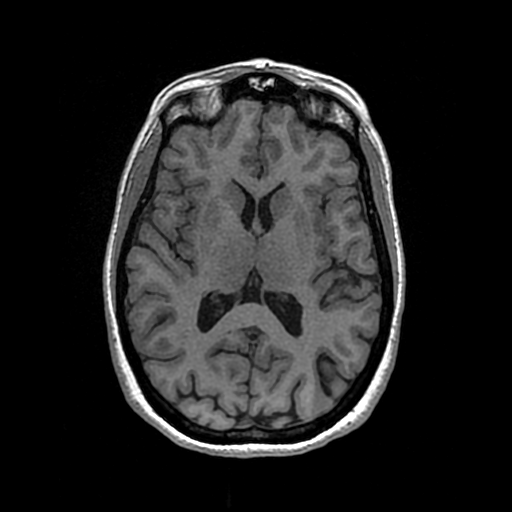
[im 150/150]
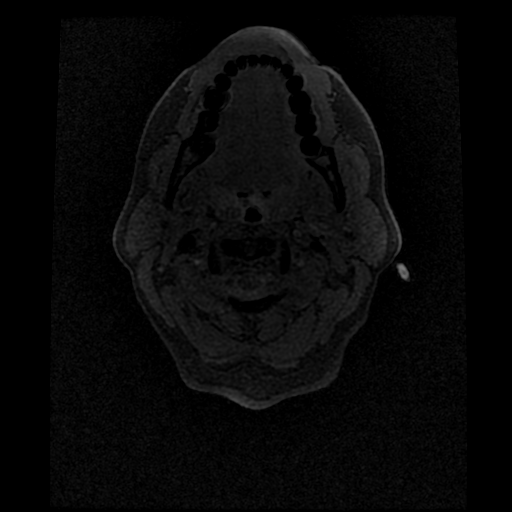

[Series 11: DWI · coronal · 4.0mm · 1.09mm/px · 2 of 86 slices shown (2 of 4)]
[im 1/86]
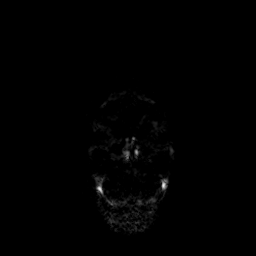
[im 86/86]
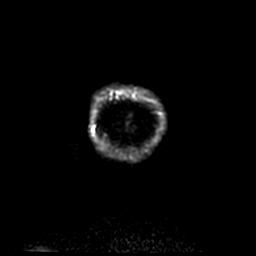

[Series 12: T2 · coronal · 5.0mm · 0.45mm/px · 1 of 27 slices shown (2 of 4)]
[im 1/27]
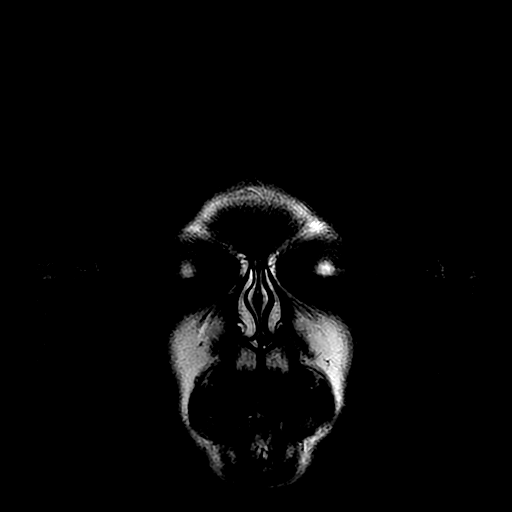

[Series 16: T2 · sagittal · 3.0mm · 0.41mm/px · 1 of 13 slices shown (3 of 4)]
[im 1/13]
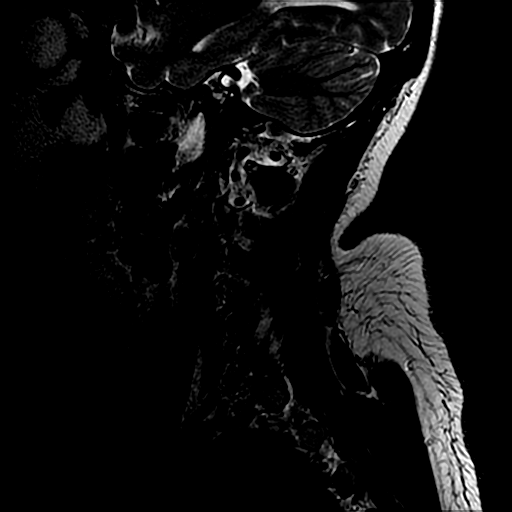

[Series 18: T2 · axial · 3.1mm · 0.35mm/px · 1 of 28 slices shown (4 of 4)]
[im 1/28]
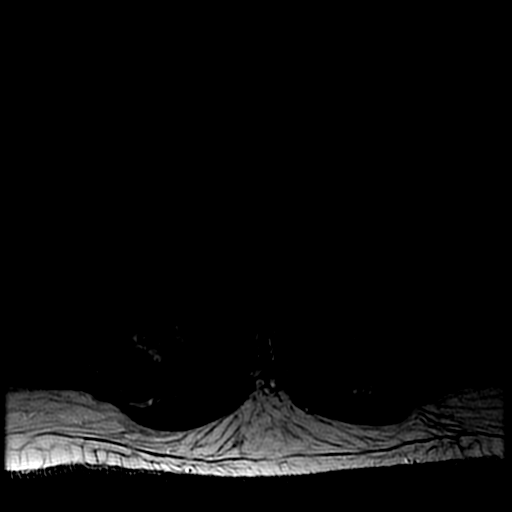

[Series 26: T1 post-contrast · axial · 1.0mm · 0.47mm/px · z∈[+82,+231]mm · 4 of 150 slices shown (1 of 3)]
[im 1/150]
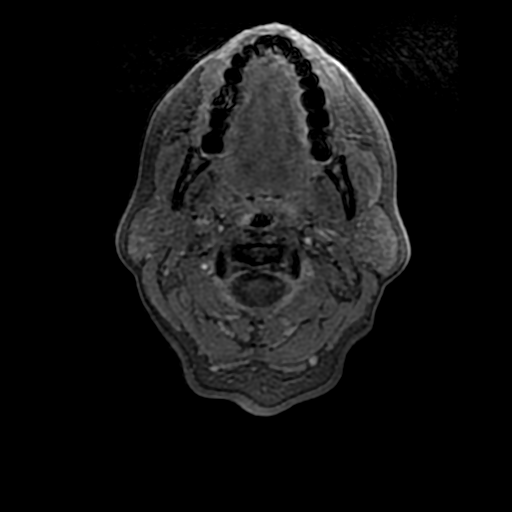
[im 50/150]
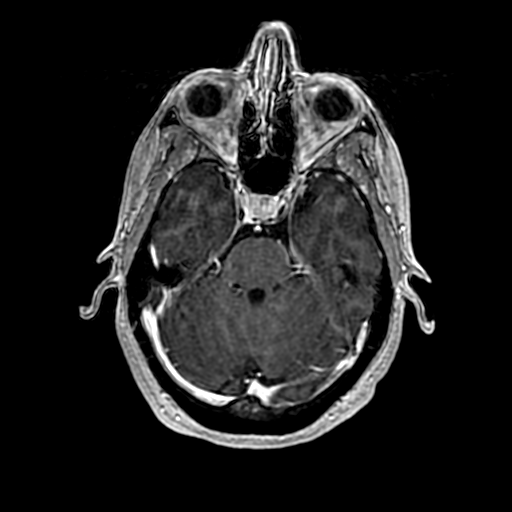
[im 100/150]
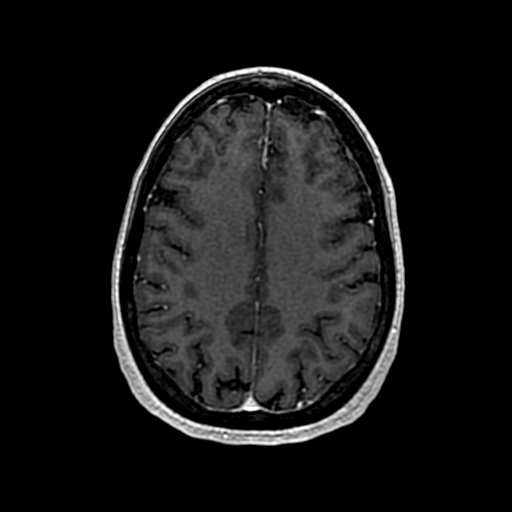
[im 150/150]
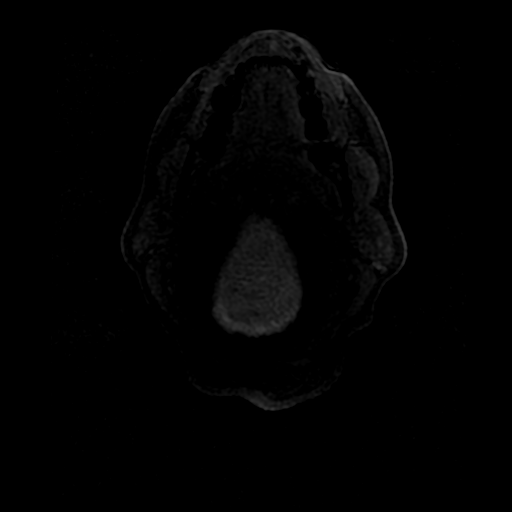

[Series 27: T1 post-contrast · coronal · 5.0mm · 0.43mm/px · 1 of 26 slices shown (2 of 3)]
[im 1/26]
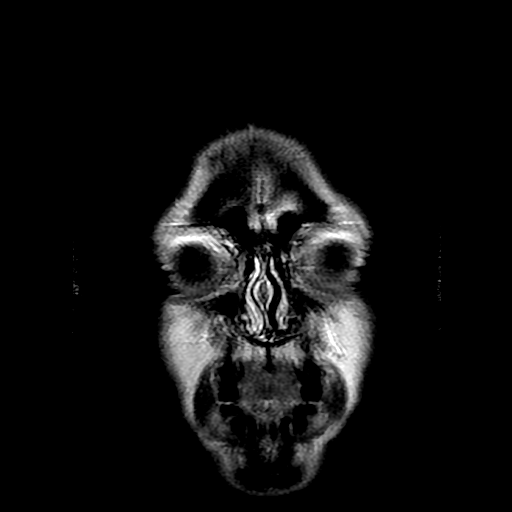

[Series 31: T1 post-contrast · axial · 3.0mm · 0.35mm/px · 1 of 34 slices shown (3 of 3)]
[im 1/34]
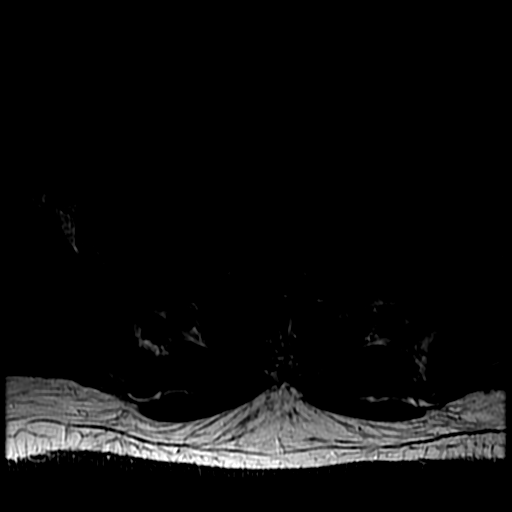

[Series 400: DWI · axial · 3.0mm · 1.09mm/px · 1 of 48 slices shown (3 of 4)]
[im 1/48]
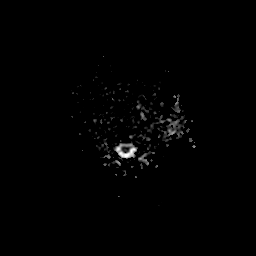

[Series 1100: DWI · coronal · 4.0mm · 1.09mm/px · 1 of 43 slices shown (4 of 4)]
[im 1/43]
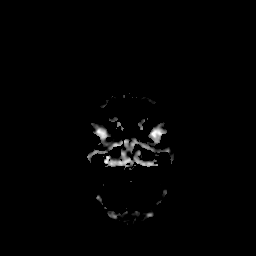

[21 of 48 positions shown; findings below may reference images not displayed]

Angiographic images of the Circle of Willis were obtained using MRA
technique without intravenous contrast. Angiographic images of the
neck were obtained using MRA technique without and with intravenous
contrast. Carotid stenosis measurements (when applicable) are
obtained utilizing NASCET criteria, using the distal internal
carotid diameter as the denominator.

CONTRAST:  7 mL Gadavist
FINDINGS: MRI HEAD FINDINGS

Brain: There is no evidence of acute infarct, intracranial
hemorrhage, mass, midline shift, or extra-axial fluid collection.
The ventricles and sulci are normal. The brain is normal in signal.
No abnormal enhancement is identified.

Vascular: Major intracranial vascular flow voids are preserved.

Skull and upper cervical spine: Unremarkable bone marrow signal.

Sinuses/Orbits: Unremarkable orbits. Paranasal sinuses and mastoid
air cells are clear.

Other: None.

MRA HEAD FINDINGS

The visualized distal vertebral arteries are widely patent to the
basilar. A patent right PICA and bilateral SCAs are visualized. A
ICAs and a left PICA are not clearly identified. The basilar artery
is widely patent. There is a small to moderate-sized left posterior
communicating artery without a right posterior communicating artery
identified. Both PCAs are patent without evidence of significant
stenosis.

The internal carotid arteries are widely patent from skull base to
carotid termini. Both cavernous segments are tortuous. ACAs and MCAs
are patent without evidence of proximal branch occlusion or
significant proximal stenosis. The right A1 segment is hypoplastic
and the right ACA is predominantly supplied by the left ACA. No
aneurysm is identified.

MRA NECK FINDINGS

There is a standard 3 vessel aortic arch. The brachiocephalic and
subclavian arteries are widely patent. The cervical carotid arteries
are patent without evidence of stenosis or dissection. The vertebral
arteries are patent and codominant with antegrade flow bilaterally
and no evidence of significant or dissection.

MRI CERVICAL SPINE FINDINGS

The axial spin echo T2 sequence is moderately motion degraded, and
there is mild motion on other sequences.

Alignment: Normal.

Vertebrae: No fracture, suspicious osseous lesion, or significant
marrow edema.

Cord: Normal signal and morphology.  No abnormal enhancement.

Posterior Fossa, vertebral arteries, paraspinal tissues:
Unremarkable.

Disc levels:

C2-3: Negative.

C3-4: A right posterolateral disc protrusion results in mild right
neural foraminal stenosis without spinal stenosis.

C4-5: Minimal disc bulging without stenosis.

C5-6: Mild disc bulging/broad posterior disc protrusion without
stenosis.

C6-7: Small central disc protrusion without stenosis.

C7-T1: Negative.
IMPRESSION: 1. Negative head MRI.
2. Negative head and neck MRAs.
3. Mild cervical spondylosis without spinal stenosis.
4. Mild right neural foraminal stenosis at C3-4 due to a disc
protrusion.
5. Normal appearance of the cervical spinal cord.

## 2022-05-13 ENCOUNTER — Other Ambulatory Visit: Payer: Self-pay | Admitting: Rehabilitation

## 2022-05-13 DIAGNOSIS — M47816 Spondylosis without myelopathy or radiculopathy, lumbar region: Secondary | ICD-10-CM

## 2022-06-04 ENCOUNTER — Other Ambulatory Visit: Payer: Federal, State, Local not specified - PPO

## 2022-06-20 ENCOUNTER — Other Ambulatory Visit: Payer: Federal, State, Local not specified - PPO

## 2022-07-04 ENCOUNTER — Other Ambulatory Visit: Payer: Federal, State, Local not specified - PPO

## 2022-11-20 ENCOUNTER — Other Ambulatory Visit (HOSPITAL_COMMUNITY): Payer: Self-pay

## 2022-11-20 MED ORDER — WEGOVY 0.25 MG/0.5ML ~~LOC~~ SOAJ
0.2500 mg | SUBCUTANEOUS | 0 refills | Status: AC
  Filled 2022-11-20 – 2022-12-03 (×3): qty 2, 28d supply, fill #0

## 2022-11-21 ENCOUNTER — Other Ambulatory Visit (HOSPITAL_COMMUNITY): Payer: Self-pay

## 2022-11-22 ENCOUNTER — Other Ambulatory Visit (HOSPITAL_COMMUNITY): Payer: Self-pay

## 2022-11-28 ENCOUNTER — Other Ambulatory Visit (HOSPITAL_COMMUNITY): Payer: Self-pay

## 2022-12-03 ENCOUNTER — Other Ambulatory Visit (HOSPITAL_COMMUNITY): Payer: Self-pay

## 2022-12-06 ENCOUNTER — Other Ambulatory Visit (HOSPITAL_COMMUNITY): Payer: Self-pay
